# Patient Record
Sex: Female | Born: 1980 | Hispanic: No | Marital: Married | Smoking: Never smoker
Health system: Southern US, Community
[De-identification: ages and names within clinical notes are randomized; demographics above are authoritative.]

## PROBLEM LIST (undated history)

## (undated) DIAGNOSIS — K589 Irritable bowel syndrome without diarrhea: Secondary | ICD-10-CM

## (undated) DIAGNOSIS — F419 Anxiety disorder, unspecified: Secondary | ICD-10-CM

## (undated) DIAGNOSIS — K635 Polyp of colon: Secondary | ICD-10-CM

## (undated) DIAGNOSIS — T7840XA Allergy, unspecified, initial encounter: Secondary | ICD-10-CM

## (undated) DIAGNOSIS — K900 Celiac disease: Secondary | ICD-10-CM

## (undated) DIAGNOSIS — A0472 Enterocolitis due to Clostridium difficile, not specified as recurrent: Secondary | ICD-10-CM

## (undated) DIAGNOSIS — K219 Gastro-esophageal reflux disease without esophagitis: Secondary | ICD-10-CM

## (undated) DIAGNOSIS — D649 Anemia, unspecified: Secondary | ICD-10-CM

## (undated) HISTORY — DX: Anemia, unspecified: D64.9

## (undated) HISTORY — DX: Enterocolitis due to Clostridium difficile, not specified as recurrent: A04.72

## (undated) HISTORY — PX: COLONOSCOPY: SHX174

## (undated) HISTORY — DX: Irritable bowel syndrome, unspecified: K58.9

## (undated) HISTORY — DX: Celiac disease: K90.0

## (undated) HISTORY — DX: Polyp of colon: K63.5

## (undated) HISTORY — PX: UPPER GASTROINTESTINAL ENDOSCOPY: SHX188

## (undated) HISTORY — DX: Anxiety disorder, unspecified: F41.9

## (undated) HISTORY — DX: Gastro-esophageal reflux disease without esophagitis: K21.9

## (undated) HISTORY — DX: Allergy, unspecified, initial encounter: T78.40XA

---

## 2015-08-05 ENCOUNTER — Other Ambulatory Visit: Payer: Self-pay | Admitting: Gastroenterology

## 2015-08-05 DIAGNOSIS — R1033 Periumbilical pain: Secondary | ICD-10-CM

## 2015-08-10 ENCOUNTER — Ambulatory Visit (HOSPITAL_COMMUNITY)
Admission: RE | Admit: 2015-08-10 | Discharge: 2015-08-10 | Disposition: A | Discharge status: Home / Self Care | Payer: Managed Care, Other (non HMO) | Source: Ambulatory Visit | Attending: Gastroenterology | Admitting: Gastroenterology

## 2015-08-10 DIAGNOSIS — R1033 Periumbilical pain: Secondary | ICD-10-CM | POA: Diagnosis present

## 2015-08-12 ENCOUNTER — Ambulatory Visit (HOSPITAL_COMMUNITY)
Admission: RE | Admit: 2015-08-12 | Discharge: 2015-08-12 | Disposition: A | Discharge status: Home / Self Care | Payer: Managed Care, Other (non HMO) | Source: Ambulatory Visit | Attending: Gastroenterology | Admitting: Gastroenterology

## 2015-08-12 DIAGNOSIS — R1033 Periumbilical pain: Secondary | ICD-10-CM | POA: Diagnosis present

## 2015-08-12 DIAGNOSIS — R1011 Right upper quadrant pain: Secondary | ICD-10-CM | POA: Diagnosis not present

## 2015-08-12 DIAGNOSIS — R110 Nausea: Secondary | ICD-10-CM | POA: Insufficient documentation

## 2015-08-12 MED ORDER — SINCALIDE 5 MCG IJ SOLR
0.0200 ug/kg | Freq: Once | INTRAMUSCULAR | Status: AC
  Administered 2015-08-12: 1.1 ug via INTRAVENOUS

## 2015-08-12 MED ORDER — TECHNETIUM TC 99M MEBROFENIN IV KIT
5.0000 | PACK | Freq: Once | INTRAVENOUS | Status: DC | PRN
  Administered 2015-08-12: 5 via INTRAVENOUS
  Filled 2015-08-12: qty 6

## 2015-08-12 MED ORDER — SINCALIDE 5 MCG IJ SOLR
INTRAMUSCULAR | Status: AC
  Administered 2015-08-12: 1.1 ug via INTRAVENOUS
  Filled 2015-08-12: qty 5

## 2015-08-12 MED ORDER — STERILE WATER FOR INJECTION IJ SOLN
INTRAMUSCULAR | Status: AC
  Filled 2015-08-12: qty 10

## 2015-10-20 ENCOUNTER — Other Ambulatory Visit: Payer: Self-pay | Admitting: Gastroenterology

## 2015-10-20 DIAGNOSIS — R1033 Periumbilical pain: Secondary | ICD-10-CM

## 2015-10-20 DIAGNOSIS — R634 Abnormal weight loss: Secondary | ICD-10-CM

## 2015-10-30 ENCOUNTER — Other Ambulatory Visit: Payer: Managed Care, Other (non HMO)

## 2015-10-30 ENCOUNTER — Ambulatory Visit
Admission: RE | Admit: 2015-10-30 | Discharge: 2015-10-30 | Disposition: A | Discharge status: Home / Self Care | Payer: Managed Care, Other (non HMO) | Source: Ambulatory Visit | Attending: Gastroenterology | Admitting: Gastroenterology

## 2015-10-30 DIAGNOSIS — R1033 Periumbilical pain: Secondary | ICD-10-CM

## 2015-10-30 DIAGNOSIS — R634 Abnormal weight loss: Secondary | ICD-10-CM

## 2015-10-30 MED ORDER — IOPAMIDOL (ISOVUE-300) INJECTION 61%
100.0000 mL | Freq: Once | INTRAVENOUS | Status: AC | PRN
Start: 2015-10-30 — End: 2015-10-30
  Administered 2015-10-30: 100 mL via INTRAVENOUS

## 2015-11-04 ENCOUNTER — Telehealth: Payer: Self-pay | Admitting: Obstetrics and Gynecology

## 2015-11-04 NOTE — Telephone Encounter (Signed)
Called and left a message for patient to call back to schedule a new patient doctor referral. °

## 2015-11-05 ENCOUNTER — Encounter: Payer: Self-pay | Admitting: Obstetrics and Gynecology

## 2015-11-05 ENCOUNTER — Ambulatory Visit (INDEPENDENT_AMBULATORY_CARE_PROVIDER_SITE_OTHER): Payer: Managed Care, Other (non HMO) | Admitting: Obstetrics and Gynecology

## 2015-11-05 VITALS — BP 100/60 | HR 68 | Resp 14 | Ht 63.25 in | Wt 110.0 lb

## 2015-11-05 DIAGNOSIS — R140 Abdominal distension (gaseous): Secondary | ICD-10-CM

## 2015-11-05 DIAGNOSIS — R1033 Periumbilical pain: Secondary | ICD-10-CM

## 2015-11-05 DIAGNOSIS — R110 Nausea: Secondary | ICD-10-CM | POA: Diagnosis not present

## 2015-11-05 DIAGNOSIS — N946 Dysmenorrhea, unspecified: Secondary | ICD-10-CM | POA: Diagnosis not present

## 2015-11-05 NOTE — Progress Notes (Signed)
Patient ID: Jane Torres, female   DOB: June 29, 1981, 34 y.o.   MRN: [id number] GYNECOLOGY  VISIT   HPI: 34 y.o.   Married  [place]  female   [phone] with Patient's last menstrual period was 10/17/2015.   The patient is sent for a consultation from Dr Collene Mares for c/o abdominal pain, weight loss and fatigue  x 6 months   W/U to date has included, normal CBC, CMP, TSH, negative RA, normal HgbA1C, negative food allergy testing, negative Sjogren's panel, negative scleroderma panel, normal abdominal ultrasound, normal abdominal/pelvic CT (1 cm myoma noted). She has had an endoscopy, biopsy with intra-epithelial lymphocytosis, otherwise normal. She does have a h/o constipation and an anal fissure. Currently BM's qd. No urinary c/o. Her pain started 6 months ago. She describes it as an intermittent, burning, periumbilical pain that is up to a 4-5/10 in severity. The pain can come off and on x 3-4 days. She has a gluten sensitivity. Has been gluten free x 4 months. Her symptoms continue. No dietary changes in her pain. She feels bloated. A different crampy abdominal pain stopped with stopping gluten. Normal BM qam. She has some nausea, no emesis. She does report feeling hungry, but fills up quickly. She can go up to 4 days without pain.  Menses q month x 4-5 days. Changes her pad every 4 hours. No BTB. 1 day of cramps, up to a 7/10 in severity, doesn't take any medication, able to function.  Sexually active, no dyspareunia. Not trying to get pregnant, using condoms most of the time.   GYNECOLOGIC HISTORY: Patient's last menstrual period was 10/17/2015. Contraception:condoms Menopausal hormone therapy: None        OB History    Gravida Para Term Preterm AB TAB SAB Ectopic Multiple Living   2 1 1  1  1   1     1   NSVD, and 1 SAB (with medication)  There are no active problems to display for this patient.   Past Medical History  Diagnosis Date  . Anxiety   . Celiac disease     History  reviewed. No pertinent past surgical history.  Current Outpatient Prescriptions  Medication Sig Dispense Refill  . Naproxen Sodium (ALEVE PO) Take by mouth.    . Probiotic Product (PROBIOTIC DAILY PO) Take by mouth.     No current facility-administered medications for this visit.     ALLERGIES: Gluten meal  History reviewed. No pertinent family history.  Social History   Social History  . Marital Status: Married    Spouse Name: N/A  . Number of Children: N/A  . Years of Education: N/A   Occupational History  . Not on file.   Social History Main Topics  . Smoking status: Never Smoker   . Smokeless tobacco: Never Used  . Alcohol Use: No  . Drug Use: No  . Sexual Activity:    Partners: Male   Other Topics Concern  . Not on file   Social History Narrative  . No narrative on file   No significant family medical history.   Review of Systems  Constitutional: Positive for weight loss and malaise/fatigue.  Gastrointestinal: Positive for abdominal pain.  All other systems reviewed and are negative.  Records from Dr Collene Mares reviewed, including labs, CT and abdominal ultrasound   PHYSICAL EXAMINATION:    BP 100/60 mmHg  Pulse 68  Resp 14  Ht 5' 3.25" (1.607 m)  Wt 110 lb (49.896 kg)  BMI 19.32 kg/m2  LMP 10/17/2015    General appearance: alert, cooperative and appears stated age Head: Normocephalic, without obvious abnormality, atraumatic Neck: no adenopathy, supple, symmetrical, trachea midline and thyroid normal to inspection and palpation Lungs: clear to auscultation bilaterally Heart: regular rate and rhythm Abdomen: soft, tender in epigastric area and to the left of the umbilicus. Less tender with tensing her abdominal muscles. No rebound, no guarding, mildly distended Extremities: extremities normal, atraumatic, no cyanosis or edema Skin: Skin color, texture, turgor normal. No rashes or lesions Lymph nodes: Cervical, supraclavicular, and axillary nodes  normal. No abnormal inguinal nodes palpated Neurologic: Grossly normal Pelvic: External genitalia:  no lesions              Urethra:  normal appearing urethra with no masses, tenderness or lesions              Bartholins and Skenes: normal                 Vagina: normal appearing vagina with normal color and discharge, no lesions              Cervix: no lesions              Bimanual Exam:  Uterus:  normal size, contour, position, consistency, mobility, non-tender. No pelvic floor tenderness              Adnexa: no mass, fullness, tenderness              Rectovaginal: Yes.  .  Confirms.              Anus:  normal sphincter tone, no lesions  Chaperone was present for exam.  ASSESSMENT 6 month h/o intermittent periumbilical pain, c/o gas and nausea, normal BM's. Normal abdominal/pelvic CT, normal pelvic exam. No urinary c/o, not cw musculoskeletal pain, no hernia. I don't think her pain is of a GYN origin Epigastric pain Sexually active, intermittent use of condoms Dysmenorrhea is tolerable, declines medication    PLAN Recommend she be on a multivitamin with folic acid (inconsistent birth control) Try gas-x or beano F/u with Dr Collene Mares, I do not feel her pain is from a GYN origin F/U in 2 months for an annual exam  CC: Dr Collene Mares   An After Visit Summary was printed and given to the patient.

## 2015-11-05 NOTE — Patient Instructions (Signed)
Try Gas-X or Beano  F/u with Dr Collene Mares

## 2015-11-11 ENCOUNTER — Other Ambulatory Visit: Payer: Self-pay

## 2015-12-09 ENCOUNTER — Ambulatory Visit: Payer: Managed Care, Other (non HMO) | Admitting: Obstetrics and Gynecology

## 2015-12-16 ENCOUNTER — Ambulatory Visit: Payer: Managed Care, Other (non HMO) | Admitting: Obstetrics and Gynecology

## 2015-12-24 ENCOUNTER — Encounter: Payer: Self-pay | Admitting: Obstetrics and Gynecology

## 2015-12-24 ENCOUNTER — Ambulatory Visit (INDEPENDENT_AMBULATORY_CARE_PROVIDER_SITE_OTHER): Payer: Managed Care, Other (non HMO) | Admitting: Obstetrics and Gynecology

## 2015-12-24 VITALS — BP 100/60 | HR 80 | Resp 14 | Ht 63.25 in | Wt 109.0 lb

## 2015-12-24 DIAGNOSIS — K602 Anal fissure, unspecified: Secondary | ICD-10-CM

## 2015-12-24 DIAGNOSIS — N946 Dysmenorrhea, unspecified: Secondary | ICD-10-CM | POA: Diagnosis not present

## 2015-12-24 DIAGNOSIS — Z01419 Encounter for gynecological examination (general) (routine) without abnormal findings: Secondary | ICD-10-CM

## 2015-12-24 DIAGNOSIS — L293 Anogenital pruritus, unspecified: Secondary | ICD-10-CM

## 2015-12-24 DIAGNOSIS — Z124 Encounter for screening for malignant neoplasm of cervix: Secondary | ICD-10-CM

## 2015-12-24 MED ORDER — BETAMETHASONE VALERATE 0.1 % EX OINT: TOPICAL_OINTMENT | CUTANEOUS | Status: DC

## 2015-12-24 NOTE — Progress Notes (Signed)
Patient ID: Jane Torres, female   DOB: 1981/06/28, 35 y.o.   MRN: [id number] 35 y.o. [id number] MarriedAsianF here for annual exam.  Her prior abdominal pain has improved, she is off gluten for the last 10 days. Still having pain, but now intermittent. She had a colonoscopy was benign. Endoscopy with slight inflammation in the duodenum.She is loosing weight, has so much weakness. No help with her weakness off of gluten.  She c/o intermittent vaginal pruritus in the last 15 days. No abnormal d/c.  Menses q month x 4-5 days. Saturating a pad 3 x a day. No BTB. She has tolerable cramps.  She is using natural family planning.  Not interested in other contraception. Once she is feeling better, she would like to get pregnant again.     Patient's last menstrual period was 12/06/2015.          Sexually active: Yes.    The current method of family planning is none.    Exercising: No.  The patient does not participate in regular exercise at present. Smoker:  no  Health Maintenance: Pap:  Never per patient husband History of abnormal Pap:  N/A MMG:  Never Colonoscopy:  11-18-15 - waiting on results BMD:   Never TDaP: 5-6 years ago Gardasil: N/A   reports that she has never smoked. She has never used smokeless tobacco. She reports that she does not drink alcohol or use illicit drugs.They have lived in the [place] for the last 10 months. Son is 5.   Past Medical History  Diagnosis Date  . Anxiety   . Celiac disease     History reviewed. No pertinent past surgical history.  Current Outpatient Prescriptions  Medication Sig Dispense Refill  . Naproxen Sodium (ALEVE PO) Take by mouth.    . Probiotic Product (PROBIOTIC DAILY PO) Take by mouth.    . betamethasone valerate ointment (VALISONE) 0.1 % Apply a peas sized amount 2 x a day for 1-2 weeks as needed 15 g 0   No current facility-administered medications for this visit.    History reviewed. No pertinent family history.  Review of Systems   Constitutional: Negative.   HENT: Negative.   Eyes: Negative.   Respiratory: Negative.   Cardiovascular: Negative.   Gastrointestinal: Positive for abdominal pain.  Endocrine: Negative.   Genitourinary: Negative.   Musculoskeletal: Negative.   Skin: Negative.   Allergic/Immunologic: Negative.   Neurological: Negative.   Psychiatric/Behavioral: Negative.     Exam:   BP 100/60 mmHg  Pulse 80  Resp 14  Ht 5' 3.25" (1.607 m)  Wt 109 lb (49.442 kg)  BMI 19.15 kg/m2  LMP 12/06/2015  Weight change: @WEIGHTCHANGE @ Height:   Height: 5' 3.25" (160.7 cm)  Ht Readings from Last 3 Encounters:  12/24/15 5' 3.25" (1.607 m)  11/05/15 5' 3.25" (1.607 m)    General appearance: alert, cooperative and appears stated age Head: Normocephalic, without obvious abnormality, atraumatic Neck: no adenopathy, supple, symmetrical, trachea midline and thyroid normal to inspection and palpation Lungs: clear to auscultation bilaterally Breasts: normal appearance, no masses or tenderness Heart: regular rate and rhythm Abdomen: soft, non-tender; bowel sounds normal; no masses,  no organomegaly Extremities: extremities normal, atraumatic, no cyanosis or edema Skin: Skin color, texture, turgor normal. No rashes or lesions Lymph nodes: Cervical, supraclavicular, and axillary nodes normal. No abnormal inguinal nodes palpated Neurologic: Grossly normal   Pelvic: External genitalia:  no lesions, mild erythema  Urethra:  normal appearing urethra with no masses, tenderness or lesions              Bartholins and Skenes: normal                 Vagina: normal appearing vagina with normal color and discharge, no lesions              Cervix: no lesions, no CMT               Bimanual Exam:  Uterus:  retroverted and mobile, normal sized, mildly tender.               Adnexa: no mass, fullness, tenderness               Rectovaginal: deferred               Anus: anal fissure at 7 o'clock  Chaperone was  present for exam.  Wet prep: no clue, no trich, few wbc KOH: no yeast PH: 4.5   A:  Well Woman with normal exam  Dysmenorrhea, tolerable  Contraception, only using natural family planning, declines other contraception  Genital pruritus, negative vaginal slides  Anal fissure  Screening for cervical cancer  P:    Pap with hpv  Affirm wet prep probe  Steroid ointment for pruritus   Discussed miralax or metamucil to help heal her anal fissure  Start prenatal vitamins  F/U with internist for c/o weakness and weight loss. She is down 1 lb in the last 6 weeks.

## 2015-12-24 NOTE — Patient Instructions (Addendum)
Start a pre-natal vitamins  You can try metmucil or miralax  Anal Fissure, Adult An anal fissure is a small tear or crack in the skin around the anus. Bleeding from a fissure usually stops on its own within a few minutes. However, bleeding will often occur again with each bowel movement until the crack heals. CAUSES This condition may be caused by:  Passing large, hard stool (feces).  Frequent diarrhea.  Constipation.  Inflammatory bowel disease (Crohn disease or ulcerative colitis).  Infections.  Anal sex. SYMPTOMS Symptoms of this condition include:  Bleeding from the rectum.  Small amounts of blood seen on your stool, on toilet paper, or in the toilet after a bowel movement.  Painful bowel movements.  Itching or irritation around the anus. DIAGNOSIS A health care provider may diagnose this condition by closely examining the anal area. An anal fissure can usually be seen with careful inspection. In some cases, a rectal exam may be performed, or a short tube (anoscope) may be used to examine the anal canal. TREATMENT Treatment for this condition may include:  Taking steps to avoid constipation. This may include making changes to your diet, such as increasing your intake of fiber or fluid.  Taking fiber supplements. These supplements can soften your stool to help make bowel movements easier. Your health care provider may also prescribe a stool softener if your stool is often hard.  Taking sitz baths. This may help to heal the tear.  Using medicated creams or ointments. These may be prescribed to lessen discomfort. HOME CARE INSTRUCTIONS Eating and Drinking  Avoid foods that may be constipating, such as bananas and dairy products.  Drink enough fluid to keep your urine clear or pale yellow.  Maintain a diet that is high in fruits, whole grains, and vegetables. General Instructions  Keep the anal area as clean and dry as possible.  Take sitz baths as told by your  health care provider. Do not use soap in the sitz baths.  Take over-the-counter and prescription medicines only as told by your health care provider.  Use creams or ointments only as told by your health care provider.  Keep all follow-up visits as told by your health care provider. This is important. SEEK MEDICAL CARE IF:  You have more bleeding.  You have a fever.  You have diarrhea that is mixed with blood.  You continue to have pain.  Your problem is getting worse rather than better.   This information is not intended to replace advice given to you by your health care provider. Make sure you discuss any questions you have with your health care provider.   Document Released: 10/31/2005 Document Revised: 07/22/2015 Document Reviewed: 01/26/2015 Elsevier Interactive Patient Education 2016 [place] AND DIET:  We recommended that you start or continue a regular exercise program for good health. Regular exercise means any activity that makes your heart beat faster and makes you sweat.  We recommend exercising at least 30 minutes per day at least 3 days a week, preferably 4 or 5.  We also recommend a diet low in fat and sugar.  Inactivity, poor dietary choices and obesity can cause diabetes, heart attack, stroke, and kidney damage, among others.    ALCOHOL AND SMOKING:  Women should limit their alcohol intake to no more than 7 drinks/beers/glasses of wine (combined, not each!) per week. Moderation of alcohol intake to this level decreases your risk of breast cancer and liver damage. And of course, no  recreational drugs are part of a healthy lifestyle.  And absolutely no smoking or even second hand smoke. Most people know smoking can cause heart and lung diseases, but did you know it also contributes to weakening of your bones? Aging of your skin?  Yellowing of your teeth and nails?  CALCIUM AND VITAMIN D:  Adequate intake of calcium and Vitamin D are recommended.  The  recommendations for exact amounts of these supplements seem to change often, but generally speaking 600 mg of calcium (either carbonate or citrate) and 800 units of Vitamin D per day seems prudent. Certain women may benefit from higher intake of Vitamin D.  If you are among these women, your doctor will have told you during your visit.    PAP SMEARS:  Pap smears, to check for cervical cancer or precancers,  have traditionally been done yearly, although recent scientific advances have shown that most women can have pap smears less often.  However, every woman still should have a physical exam from her gynecologist every year. It will include a breast check, inspection of the vulva and vagina to check for abnormal growths or skin changes, a visual exam of the cervix, and then an exam to evaluate the size and shape of the uterus and ovaries.  And after 33 years of age, a rectal exam is indicated to check for rectal cancers. We will also provide age appropriate advice regarding health maintenance, like when you should have certain vaccines, screening for sexually transmitted diseases, bone density testing, colonoscopy, mammograms, etc.   MAMMOGRAMS:  All women over 58 years old should have a yearly mammogram. Many facilities now offer a "3D" mammogram, which may cost around $50 extra out of pocket. If possible,  we recommend you accept the option to have the 3D mammogram performed.  It both reduces the number of women who will be called back for extra views which then turn out to be normal, and it is better than the routine mammogram at detecting truly abnormal areas.    COLONOSCOPY:  Colonoscopy to screen for colon cancer is recommended for all women at age 29.  We know, you hate the idea of the prep.  We agree, BUT, having colon cancer and not knowing it is worse!!  Colon cancer so often starts as a polyp that can be seen and removed at colonscopy, which can quite literally save your life!  And if your first  colonoscopy is normal and you have no family history of colon cancer, most women don't have to have it again for 10 years.  Once every ten years, you can do something that may end up saving your life, right?  We will be happy to help you get it scheduled when you are ready.  Be sure to check your insurance coverage so you understand how much it will cost.  It may be covered as a preventative service at no cost, but you should check your particular policy.

## 2015-12-25 ENCOUNTER — Telehealth: Payer: Self-pay

## 2015-12-25 LAB — WET PREP BY MOLECULAR PROBE
Candida species: NEGATIVE
Gardnerella vaginalis: POSITIVE — AB
Trichomonas vaginosis: NEGATIVE

## 2015-12-25 MED ORDER — METRONIDAZOLE 0.75 % VA GEL: 1.0000 | Freq: Every day | VAGINAL | Status: DC

## 2015-12-25 NOTE — Telephone Encounter (Signed)
-----   Message from Salvadore Dom, MD sent at 12/25/2015  9:06 AM EST ----- Please inform the patient that her vaginitis probe was + for BV and treat with flagyl (either oral or vaginal, her choice), no ETOH while on Flagyl.  Oral: Flagyl 500 mg BID x 7 days, or Vaginal: Metrogel, 1 applicator per vagina q day x 5 days.

## 2015-12-25 NOTE — Telephone Encounter (Signed)
Spoke with patient. Advised of results as seen below from [place]. She is agreeable and verbalizes understanding. She would like to use Metrogel. Rx for Metrogel 1 applicator per vagina q day x 5 days sent to pharmacy on file. The patient is agreeable.  Routing to provider for final review. Patient agreeable to disposition. Will close encounter.

## 2015-12-28 LAB — IPS PAP TEST WITH HPV

## 2016-01-11 DIAGNOSIS — R1030 Lower abdominal pain, unspecified: Secondary | ICD-10-CM | POA: Insufficient documentation

## 2016-01-11 DIAGNOSIS — R143 Flatulence: Secondary | ICD-10-CM | POA: Insufficient documentation

## 2016-01-11 DIAGNOSIS — K580 Irritable bowel syndrome with diarrhea: Secondary | ICD-10-CM | POA: Insufficient documentation

## 2016-01-31 ENCOUNTER — Encounter: Payer: Self-pay | Admitting: Obstetrics and Gynecology

## 2016-02-01 ENCOUNTER — Ambulatory Visit (INDEPENDENT_AMBULATORY_CARE_PROVIDER_SITE_OTHER): Payer: Managed Care, Other (non HMO) | Admitting: Obstetrics and Gynecology

## 2016-02-01 ENCOUNTER — Encounter: Payer: Self-pay | Admitting: Obstetrics and Gynecology

## 2016-02-01 ENCOUNTER — Telehealth: Payer: Self-pay | Admitting: Emergency Medicine

## 2016-02-01 VITALS — BP 98/60 | HR 72 | Resp 16 | Wt 107.0 lb

## 2016-02-01 DIAGNOSIS — R1030 Lower abdominal pain, unspecified: Secondary | ICD-10-CM | POA: Diagnosis not present

## 2016-02-01 DIAGNOSIS — N8184 Pelvic muscle wasting: Secondary | ICD-10-CM

## 2016-02-01 DIAGNOSIS — R102 Pelvic and perineal pain unspecified side: Secondary | ICD-10-CM

## 2016-02-01 DIAGNOSIS — R300 Dysuria: Secondary | ICD-10-CM

## 2016-02-01 DIAGNOSIS — M6289 Other specified disorders of muscle: Secondary | ICD-10-CM

## 2016-02-01 LAB — CBC WITH DIFFERENTIAL/PLATELET
Basophils Absolute: 0 10*3/uL (ref 0.0–0.1)
Basophils Relative: 0 % (ref 0–1)
Eosinophils Absolute: 0.1 10*3/uL (ref 0.0–0.7)
Eosinophils Relative: 1 % (ref 0–5)
HCT: 36.5 % (ref 36.0–46.0)
Hemoglobin: 12.3 g/dL (ref 12.0–15.0)
Lymphocytes Relative: 30 % (ref 12–46)
Lymphs Abs: 2.8 10*3/uL (ref 0.7–4.0)
MCH: 31.1 pg (ref 26.0–34.0)
MCHC: 33.7 g/dL (ref 30.0–36.0)
MCV: 92.2 fL (ref 78.0–100.0)
MPV: 10.1 fL (ref 8.6–12.4)
Monocytes Absolute: 0.7 10*3/uL (ref 0.1–1.0)
Monocytes Relative: 7 % (ref 3–12)
Neutro Abs: 5.8 10*3/uL (ref 1.7–7.7)
Neutrophils Relative %: 62 % (ref 43–77)
Platelets: 355 10*3/uL (ref 150–400)
RBC: 3.96 MIL/uL (ref 3.87–5.11)
RDW: 13 % (ref 11.5–15.5)
WBC: 9.4 10*3/uL (ref 4.0–10.5)

## 2016-02-01 LAB — POCT URINALYSIS DIPSTICK
Bilirubin, UA: NEGATIVE
Blood, UA: NEGATIVE
Glucose, UA: NEGATIVE
Ketones, UA: NEGATIVE
Leukocytes, UA: NEGATIVE
Nitrite, UA: NEGATIVE
Protein, UA: NEGATIVE
Urobilinogen, UA: NEGATIVE
pH, UA: 7

## 2016-02-01 LAB — POCT URINE PREGNANCY: Preg Test, Ur: NEGATIVE

## 2016-02-01 MED ORDER — NAPROXEN SODIUM 550 MG PO TABS: ORAL_TABLET | ORAL | Status: DC

## 2016-02-01 NOTE — Telephone Encounter (Signed)
Call to patient. She states she had her cycle 3/12 to 3/16 and this was a normal cycle for her, since her cycle ended she has had feelings of lower pelvic pain (diffuse in location) and describes pain as feeling just like it usually does when she is on her first day of her cycle. Has not tried any OTC medications for pain. She states when she presses on the area that hurts, she feels pressure in the vagina. She states she had constipation with her cycle but is not having normal bowel movements. Reports she feels some pressure with urination, however, she states she does not have an urge to urinate or pain with urination.   Office visit today at 1430 with Dr. Talbert Nan scheduled. Patient agreeable.   Routing to provider for final review. Patient agreeable to disposition. Will close encounter.

## 2016-02-01 NOTE — Telephone Encounter (Signed)
Telephone call for triage created to discuss message with patient and disposition as appropriate.   

## 2016-02-01 NOTE — Patient Instructions (Signed)
Call with fevers, worsening pain, or any other concerns

## 2016-02-01 NOTE — Telephone Encounter (Signed)
Chief Complaint  Patient presents with  . Advice Only    Patient sent mychart message     ===View-only below this line===   ----- Message -----    From: Jacqulyn Liner    Sent: 01/31/2016  9:46 PM EDT      To: Salvadore Dom, MD Subject: Non-Urgent Medical Question  Hi Dr Sharee Pimple   I am having lower abdominal pain and back pain like mensturation pain for last 3 days,which started just after period(3/12 -3/16). Means from 16th night.  Do u have any advice for me.  Thanks  [employer]

## 2016-02-01 NOTE — Progress Notes (Signed)
Patient ID: Jane Torres, female   DOB: 08-21-1981, 35 y.o.   MRN: [id number] GYNECOLOGY  VISIT   HPI: 35 y.o.   Married  [place]  female   [phone] with Patient's last menstrual period was 01/24/2016.   here c/o pelvic pain that started 4 days ago, just after her cycle ended. The pain in her BLQ, mid abdomen, she also c/o bilateral lower back pain. Some distention. The pain is crampy like menstrual pain, constant. Pain ranges from 4-7/10 in severity. Getting worse. No fever, but feels chills. Slight dysuria, some urinary pressure, some urinary urgency, no urinary frequency, voiding normal amounts. When the pain is severe she has some hesitancy to void. No nausea or emesis, h/o constipation, but not now. She does feel a little better after a BM. 2 BM's today.  No dyspareunia, but she does c/o lower abdominal pain for 1-2 days after intercourse over the last month.  She was given an antibiotic by GI for bacterial overgrowth.    GYNECOLOGIC HISTORY: Patient's last menstrual period was 01/24/2016. Contraception:none  Menopausal hormone therapy: none         OB History    Gravida Para Term Preterm AB TAB SAB Ectopic Multiple Living   2 1 1  1  1   1          There are no active problems to display for this patient.   Past Medical History  Diagnosis Date  . Anxiety   . Celiac disease     History reviewed. No pertinent past surgical history.  Current Outpatient Prescriptions  Medication Sig Dispense Refill  . B Complex-C (B-COMPLEX WITH VITAMIN C) tablet Take 1 tablet by mouth daily.    . Probiotic Product (PROBIOTIC DAILY PO) Take by mouth.     No current facility-administered medications for this visit.     ALLERGIES: Gluten meal  History reviewed. No pertinent family history.  Social History   Social History  . Marital Status: Married    Spouse Name: N/A  . Number of Children: N/A  . Years of Education: N/A   Occupational History  . Not on file.   Social  History Main Topics  . Smoking status: Never Smoker   . Smokeless tobacco: Never Used  . Alcohol Use: No  . Drug Use: No  . Sexual Activity:    Partners: Male   Other Topics Concern  . Not on file   Social History Narrative    Review of Systems  Constitutional: Negative.   HENT: Negative.   Eyes: Negative.   Respiratory: Negative.   Cardiovascular: Negative.   Gastrointestinal: Negative.   Genitourinary:       Pelvic pain   Musculoskeletal: Negative.   Skin: Negative.   Neurological: Negative.   Endo/Heme/Allergies: Negative.   Psychiatric/Behavioral: Negative.     PHYSICAL EXAMINATION:    BP 98/60 mmHg  Pulse 72  Resp 16  Wt 107 lb (48.535 kg)  LMP 01/24/2016    General appearance: alert, cooperative and appears stated age Abdomen: soft, mildly tender BLQ, mildly distended, no masses. No rebound or guarding  Pelvic: External genitalia:  no lesions              Urethra:  normal appearing urethra with no masses, tenderness or lesions              Bartholins and Skenes: normal                 Vagina: normal appearing vagina  with normal color and discharge, no lesions              Cervix: no lesions, no CMT              Bimanual Exam:  Uterus:  normal sized, mobile, mildly tender.              Adnexa: no fullness, mildly tender bilaterally              Pelvic floor: bilaterally tender Bladder: mildly tender  Chaperone was present for exam.  ASSESSMENT 4 day h/o BLQ abdominal/pelvic pain, mild urinary c/o, but negative dip. She is tender in the BLQ and mildly distended with mild bowel symptoms. Also tender on pelvic exam, including pelvic floor. No CMT. Pelvic pain after intercourse    PLAN  UPT negative CBC with diff Return for a GYN ultrasound Send urine for ua, c&s Anaprox for pain Call with fevers, worsening pain or any other concerns Consider treatment for possible endometritis depending on f/u exam (particularly given pain after intercourse). She did  seem more tender with palpation of the pelvic floor than her uterus. No CMT If pelvic floor tenderness persists, will refer to PT   An After Visit Summary was printed and given to the patient.

## 2016-02-02 ENCOUNTER — Ambulatory Visit: Payer: Managed Care, Other (non HMO) | Admitting: Obstetrics and Gynecology

## 2016-02-02 ENCOUNTER — Telehealth: Payer: Self-pay | Admitting: Obstetrics and Gynecology

## 2016-02-02 LAB — URINALYSIS, MICROSCOPIC ONLY
Bacteria, UA: NONE SEEN [HPF]
Casts: NONE SEEN [LPF]
Crystals: NONE SEEN [HPF]
Squamous Epithelial / LPF: NONE SEEN [HPF] (ref <=5)
WBC, UA: NONE SEEN WBC/HPF (ref <=5)
Yeast: NONE SEEN [HPF]

## 2016-02-02 LAB — URINE CULTURE
Colony Count: NO GROWTH
Organism ID, Bacteria: NO GROWTH

## 2016-02-02 NOTE — Telephone Encounter (Signed)
Spoke with pt spouse (he is on patients DPR) states Ms Jane Torres not available, but he requested to schedule ultrasound. Reviewed benefits for ultrasound. Mr. Jane Torres understood and agreeable. Patient scheduled 02/04/16 with Dr Talbert Nan.  aware of arrival date and time, as well as 72 hours cancellation policy with $458 fee. No further questions. Ok to close

## 2016-02-04 ENCOUNTER — Ambulatory Visit (INDEPENDENT_AMBULATORY_CARE_PROVIDER_SITE_OTHER): Payer: Managed Care, Other (non HMO) | Admitting: Obstetrics and Gynecology

## 2016-02-04 ENCOUNTER — Ambulatory Visit (INDEPENDENT_AMBULATORY_CARE_PROVIDER_SITE_OTHER): Payer: Managed Care, Other (non HMO)

## 2016-02-04 ENCOUNTER — Encounter: Payer: Self-pay | Admitting: Obstetrics and Gynecology

## 2016-02-04 VITALS — BP 110/70 | HR 64 | Resp 16 | Wt 107.0 lb

## 2016-02-04 DIAGNOSIS — R1030 Lower abdominal pain, unspecified: Secondary | ICD-10-CM | POA: Diagnosis not present

## 2016-02-04 DIAGNOSIS — R102 Pelvic and perineal pain: Secondary | ICD-10-CM | POA: Diagnosis not present

## 2016-02-04 NOTE — Progress Notes (Signed)
GYNECOLOGY  VISIT   HPI: 35 y.o.   Married  [place]  female   [phone] with Patient's last menstrual period was 01/24/2016.   here for  F/u of lower abdominal pain and pain after intercourse. She has been having lower abdominal discomfort for the last week, a little better today compared to earlier this week. She has 1-2 BM's a day, sometimes mild constipation (firm, straining). Some bloating, she tried gas-ex and it helped some. She had a normal CBC and urine culture a few days ago. Normal GYN ultrasound today (other than a 57m focus, possible polyp). She has normal menstrual cycles.  She also has had issues with pain after intercourse over the last month or two. She was slightly tender on uterine exam earlier this week.  GYNECOLOGIC HISTORY: Patient's last menstrual period was 01/24/2016. Contraception:None Menopausal hormone therapy: None        OB History    Gravida Para Term Preterm AB TAB SAB Ectopic Multiple Living   2 1 1  1  1   1          There are no active problems to display for this patient.   Past Medical History  Diagnosis Date  . Anxiety   . Celiac disease     History reviewed. No pertinent past surgical history.  Current Outpatient Prescriptions  Medication Sig Dispense Refill  . B Complex-C (B-COMPLEX WITH VITAMIN C) tablet Take 1 tablet by mouth daily.    . naproxen sodium (ANAPROX DS) 550 MG tablet 1 tab po q 12 hours prn pain 30 tablet 2  . Probiotic Product (PROBIOTIC DAILY PO) Take by mouth.     No current facility-administered medications for this visit.     ALLERGIES: Gluten meal  History reviewed. No pertinent family history.  Social History   Social History  . Marital Status: Married    Spouse Name: N/A  . Number of Children: N/A  . Years of Education: N/A   Occupational History  . Not on file.   Social History Main Topics  . Smoking status: Never Smoker   . Smokeless tobacco: Never Used  . Alcohol Use: No  . Drug Use: No  .  Sexual Activity:    Partners: Male   Other Topics Concern  . Not on file   Social History Narrative    Review of Systems  Constitutional: Negative.   HENT: Negative.   Eyes: Negative.   Respiratory: Negative.   Cardiovascular: Negative.   Gastrointestinal: Positive for abdominal pain.       Bloating  Musculoskeletal: Negative.   Skin: Negative.   Neurological: Negative.   Endo/Heme/Allergies: Negative.   Psychiatric/Behavioral: Negative.     PHYSICAL EXAMINATION:    BP 110/70 mmHg  Pulse 64  Resp 16  Wt 107 lb (48.535 kg)  LMP 01/24/2016    General appearance: alert, cooperative and appears stated age Abdomen: soft, tender in the lower abdomen in the region of the ascending and descending colon. She is mildly distended.  Pelvic: External genitalia:  no lesions              Cervix: no CMT              Bimanual Exam:  Uterus:  normal sized, mobile, minimally tender (less tender than the other day). Retroverted.              Adnexa: no mass, fullness, tenderness  Chaperone was present for exam.  ASSESSMENT Abdominal pain, tender in the area of the ascending and descending colon, increased gas and mild constipation. I think her abdominal pain is GYN Pain after intercourse. She had a normal GYN ultrasound today. Today minimally tender with palpation of the uterus Small endometrial focus, possible polyp vs normal variation. The patient has normal cycles, not concerning    PLAN She has an appointment to f/u with GI on Monday Continue to use Gas-ex as needed, helping some Discussed the option of a trial of doxycyline for possible endometritis. She wants to wait and see if the postcoital pain persists. She will call if she wants to try the antibiotic She would like referral to an internist (will set up an appointment for her)    An After Visit Summary was printed and given to the patient.  15 minutes face to face time of which over 50% was spent in  counseling.

## 2016-02-08 ENCOUNTER — Telehealth: Payer: Self-pay | Admitting: Emergency Medicine

## 2016-02-09 NOTE — Telephone Encounter (Signed)
Patient is scheduled to establish care with Dr. Pricilla Holm at [facility].  Call to patient and husband (okay per designated party release form) given appointment information and agrees but does not have pen to write information down. I advised I would send appointment information via mychart and that paperwork from [facility] would be mailed to her home address.  Mychart message sent. He is advised to call back with any questions or concerns from his wife.  Routing to [place] as update and will close.

## 2016-04-20 ENCOUNTER — Ambulatory Visit (INDEPENDENT_AMBULATORY_CARE_PROVIDER_SITE_OTHER): Payer: Managed Care, Other (non HMO) | Admitting: Internal Medicine

## 2016-04-20 ENCOUNTER — Other Ambulatory Visit (INDEPENDENT_AMBULATORY_CARE_PROVIDER_SITE_OTHER): Payer: Managed Care, Other (non HMO)

## 2016-04-20 ENCOUNTER — Encounter: Payer: Self-pay | Admitting: Internal Medicine

## 2016-04-20 VITALS — BP 104/62 | HR 68 | Temp 98.4°F | Resp 14 | Ht 63.0 in | Wt 103.0 lb

## 2016-04-20 DIAGNOSIS — R634 Abnormal weight loss: Secondary | ICD-10-CM

## 2016-04-20 DIAGNOSIS — R143 Flatulence: Secondary | ICD-10-CM

## 2016-04-20 DIAGNOSIS — K580 Irritable bowel syndrome with diarrhea: Secondary | ICD-10-CM

## 2016-04-20 DIAGNOSIS — R197 Diarrhea, unspecified: Secondary | ICD-10-CM

## 2016-04-20 DIAGNOSIS — R1030 Lower abdominal pain, unspecified: Secondary | ICD-10-CM

## 2016-04-20 DIAGNOSIS — R140 Abdominal distension (gaseous): Secondary | ICD-10-CM

## 2016-04-20 DIAGNOSIS — E441 Mild protein-calorie malnutrition: Secondary | ICD-10-CM

## 2016-04-20 LAB — TSH: TSH: 2.07 u[IU]/mL (ref 0.35–4.50)

## 2016-04-20 LAB — COMPREHENSIVE METABOLIC PANEL
ALT: 21 U/L (ref 0–35)
AST: 18 U/L (ref 0–37)
Albumin: 4.6 g/dL (ref 3.5–5.2)
Alkaline Phosphatase: 64 U/L (ref 39–117)
BUN: 5 mg/dL — ABNORMAL LOW (ref 6–23)
CO2: 30 mEq/L (ref 19–32)
Calcium: 10 mg/dL (ref 8.4–10.5)
Chloride: 105 mEq/L (ref 96–112)
Creatinine, Ser: 0.63 mg/dL (ref 0.40–1.20)
GFR: 115.87 mL/min (ref >60.00)
Glucose, Bld: 88 mg/dL (ref 70–99)
Potassium: 4 mEq/L (ref 3.5–5.1)
Sodium: 140 mEq/L (ref 135–145)
Total Bilirubin: 1 mg/dL (ref 0.2–1.2)
Total Protein: 7.7 g/dL (ref 6.0–8.3)

## 2016-04-20 LAB — TESTOSTERONE: Testosterone: 53.57 ng/dL — ABNORMAL HIGH (ref 15.00–40.00)

## 2016-04-20 LAB — T4, FREE: Free T4: 0.88 ng/dL (ref 0.60–1.60)

## 2016-04-20 NOTE — Patient Instructions (Signed)
We will look over the records and see what we can find.   We will get you in with the nutritionist to talk to them.   We are checking the labs today.

## 2016-04-20 NOTE — Progress Notes (Signed)
Pre visit review using our clinic review tool, if applicable. No additional management support is needed unless otherwise documented below in the visit note. 

## 2016-04-20 NOTE — Progress Notes (Signed)
   Subjective:    Patient ID: Maxi Carreras, female    DOB: [birth year], 35 y.o.   MRN: [id number]  HPI The patient is a 35 YO female coming in for weight loss of 35 pounds over the last year. She does have gluten allergy although added back gluten without change to GI symptoms. Having intermittent low abdominal pain. She has discussed with her GI doctor. She has a local GI doctor as well as one at [facility]. Many GI complaints and given fodmap diet. This did not help at all. She notices that oil and spices bother her stomach. She is not taking levsin as it did not help. She is using gas-x which helps some. Overall is very frustrated with the amount of care she has had and that no one is able to find an answer. She is also having pericyclical pelvic pain. She had US done at ob/gyn which did not show any cysts. All this started right after she can to the [place] from [place] via [place]. Denies any major life changes around that time. Some of the pelvic pain is worse since miscarriage about 2 years ago. She does have records from some of that and brings some of it with her today. History is provided by her husband as well.   PMH, [facility], social history reviewed and updated.   Review of Systems  Constitutional: Positive for activity change, appetite change and unexpected weight change. Negative for fever, chills and fatigue.  HENT: Negative.   Eyes: Negative.   Respiratory: Negative for cough, chest tightness and shortness of breath.   Cardiovascular: Negative for chest pain, palpitations and leg swelling.  Gastrointestinal: Positive for nausea, abdominal pain, diarrhea and abdominal distention. Negative for vomiting and constipation.  Musculoskeletal: Negative.   Skin: Negative.   Neurological: Negative.   Psychiatric/Behavioral: Negative.       Objective:   Physical Exam  Constitutional: She is oriented to person, place, and time. She appears well-developed.  Thin  HENT:  Head:  Normocephalic and atraumatic.  Eyes: EOM are normal.  Neck: Normal range of motion.  Cardiovascular: Normal rate and regular rhythm.   Pulmonary/Chest: Effort normal and breath sounds normal. No respiratory distress. She has no wheezes. She has no rales.  Abdominal: Soft. Bowel sounds are normal. She exhibits no distension. There is tenderness. There is no rebound.  Mild tenderness on exam diffusely and especially in the lower abdomen.   Musculoskeletal: She exhibits no edema.  No joint swelling.   Neurological: She is alert and oriented to person, place, and time. Coordination normal.  Skin: Skin is warm and dry.  Psychiatric: She has a normal mood and affect.   Filed Vitals:   04/20/16 0901  BP: 104/62  Pulse: 68  Temp: 98.4 F (36.9 C)  TempSrc: Oral  Resp: 14  Height: 5' 3"  (1.6 m)  Weight: 103 lb (46.72 kg)  SpO2: 99%      Assessment & Plan:  Visit time 45 minutes: greater than 50% of that time was spent in counseling and coordination of care face to face with patient. Counseling about her condition and excessive time reviewing patient records with her and clarifying story.

## 2016-04-21 DIAGNOSIS — E441 Mild protein-calorie malnutrition: Secondary | ICD-10-CM | POA: Insufficient documentation

## 2016-04-21 DIAGNOSIS — R634 Abnormal weight loss: Secondary | ICD-10-CM | POA: Insufficient documentation

## 2016-04-21 LAB — TISSUE TRANSGLUTAMINASE, IGA: Tissue Transglutaminase Ab, IgA: 1 U/mL (ref <4)

## 2016-04-21 LAB — GLIADIN ANTIBODIES, SERUM
Gliadin IgA: 2 Units (ref <20)
Gliadin IgG: 2 Units (ref <20)

## 2016-04-21 LAB — PROGESTERONE: Progesterone: 1.2 ng/mL

## 2016-04-21 NOTE — Assessment & Plan Note (Signed)
She can continue using gas-x and probiotic. Checking some additional labs today.

## 2016-04-21 NOTE — Assessment & Plan Note (Signed)
She will continue seeing local and tertiary GI doctors. She is frustrated by lack of answers. Will need to review all her records to ensure no missed information. It is unclear if there is some stress component as this all started on arrival to [place].

## 2016-04-21 NOTE — Assessment & Plan Note (Signed)
Given this extended time period will need to review her records. She has had colonoscopy and EGD which were unrevealing. She has normal CBC and many other labs which will be reviewed. Given her history of thyroid problems during pregnancy will check thyroid again as well as celiac panel. Referred to nutrition to make sure she is able to get adequate calories to maintain her weight.

## 2016-04-21 NOTE — Assessment & Plan Note (Signed)
Due to not being able to eat due to stomach pain and bloating. Referral to nutrition for them to encourage her of options and adequate calorie intake. She may have some degree of malabsorption although that has not been shown in workup as of yet.

## 2016-04-21 NOTE — Assessment & Plan Note (Signed)
History of change with cycle is consistent with endometriosis. She did not have fibroids or ovarian cyst on [place] with gyn. Have asked her to talk to gyn about hormonal control with OCP for stability to see if this helps.

## 2016-04-22 ENCOUNTER — Telehealth: Payer: Self-pay | Admitting: Internal Medicine

## 2016-04-22 LAB — RETICULIN ANTIBODIES, IGA W TITER: Reticulin Ab, IgA: NEGATIVE

## 2016-04-22 NOTE — Telephone Encounter (Signed)
Pt request lab result. Please call her back

## 2016-04-25 ENCOUNTER — Encounter: Payer: Self-pay | Admitting: Internal Medicine

## 2016-04-25 LAB — ESTROGENS, TOTAL: Estrogen: 429.5 pg/mL

## 2016-04-26 NOTE — Telephone Encounter (Signed)
Left message informing patient that the results are visible on myChart.

## 2016-04-28 ENCOUNTER — Encounter: Payer: Self-pay | Admitting: Internal Medicine

## 2016-05-08 ENCOUNTER — Encounter: Payer: Self-pay | Admitting: Internal Medicine

## 2016-05-08 DIAGNOSIS — R197 Diarrhea, unspecified: Secondary | ICD-10-CM

## 2016-05-13 ENCOUNTER — Encounter: Payer: Self-pay | Admitting: Internal Medicine

## 2016-05-18 ENCOUNTER — Encounter: Payer: Self-pay | Admitting: Internal Medicine

## 2016-05-18 ENCOUNTER — Encounter: Payer: Managed Care, Other (non HMO) | Attending: Internal Medicine | Admitting: Dietician

## 2016-05-18 ENCOUNTER — Encounter: Payer: Self-pay | Admitting: Dietician

## 2016-05-18 VITALS — Ht 63.0 in | Wt 101.8 lb

## 2016-05-18 DIAGNOSIS — Z731 Type A behavior pattern: Secondary | ICD-10-CM | POA: Insufficient documentation

## 2016-05-18 DIAGNOSIS — E441 Mild protein-calorie malnutrition: Secondary | ICD-10-CM | POA: Diagnosis not present

## 2016-05-18 DIAGNOSIS — R634 Abnormal weight loss: Secondary | ICD-10-CM

## 2016-05-18 NOTE — Patient Instructions (Signed)
-  Try adding walnuts to a meal or snack -Add extra olive oil to anything you want -Try salmon -Try ginger and lemon water for indigestion -Continue to include protein with meals and snacks: protein powder, beans, chicken, fish, tofu, quinoa, tempeh, seitan, eggs, bulse (lentils), nuts and seeds -Try having almond milk with protein shake

## 2016-05-18 NOTE — Progress Notes (Signed)
  Medical Nutrition Therapy:  Appt start time: 255 end time:  355   Assessment:  Primary concerns today: Jane Torres is here today with her husband and son. She reports that she moved to the [place] from [place] in spring 2016 and weighed 140 lbs at that time; her weight today is 101.8 lbs. She began to develop GI symptoms shortly after moving. She complains of weakness, weight loss, headaches, abdominal pain, gas, and bloating. She also complains of occasional heartburn. During endoscopy, she reports inflammation in duodenum was noted. Started a gluten free diet and taking a probiotic. Has had normal bloodwork and CT scan. Jane Torres feels like following a gluten free diet has helped. Symptoms are intermittent; patient states she may feel well for 20-25 days but then symptoms return. Now having 1-2 normal bowel movements per day. Has some nausea but no vomiting. Patient notes more nausea around the time of menstrual period. Has been following current diet (see recall below) for past 6-7 months. Started taking protein shake about 3 months ago.   Preferred Learning Style:   No preference indicated   Learning Readiness:   Ready   MEDICATIONS: Prenatal vitamin and probiotic   DIETARY INTAKE:  Currently following gluten free and dairy free diet.    Usual eating pattern includes 3 meals and 2 snacks per day.  Avoided foods: Cabbage, peas, and cauliflower cause heartburn. Foods that cause discomfort: onion, garlic, spicy foods, gluten, dairy, peanuts, almonds  24-hr recall:  B (8:30-9 AM): fried rice in olive oil and herbs, 1 egg, 1 banana  Snk (11 AM): orange juice OR plant based protein powder mixed with water  L (12:30-1 PM): rice, beans, vegetables Snk ( PM): see morning snack (alernates between orange juice and protein powder) Snk (PM): coconut chips or banana D (8:30 PM): quinoa, vegetables, egg curry, sometimes fried potatoes Snk ( PM): chicken soup  Beverages: orange juice, chicken soup,  protein shake, 6-7 bottles of water per day  Usual physical activity: housework and occasional 15-minute walk  Estimated energy needs: 2000-2400 calories  Progress Towards Goal(s):  In progress.   Nutritional Diagnosis:  Jane Torres-3.2 Unintentional weight loss As related to unknown etiology.  As evidenced by patient report of weight loss of 40 pounds in the past year.    Intervention:  Nutrition counseling provided. Patient was encouraged to continue regular meal and snack pattern. Recommended quality protein source at each meal and snack. Patient was requested to have a second protein shake rather than skip a meal. Briefly discussed Mediterranean diet and anti-inflammatory foods. Encouraged increased intake of unsaturated fats like nuts, nut butters, seeds, fish, and olive oil.  Goals:  -Try adding walnuts to a meal or snack -Add extra olive oil to anything you want -Try salmon -Try ginger and lemon water for indigestion -Continue to include protein with meals and snacks: protein powder, beans, chicken, fish, tofu, quinoa, tempeh, seitan, eggs, bulse (lentils), nuts and seeds -Try having almond milk with protein shake instead of water  Teaching Method Utilized:  Visual Auditory Hands on  Handouts given during visit include:  none  Barriers to learning/adherence to lifestyle change: abdominal pain, bloating, and discomfort  Demonstrated degree of understanding via:  Teach Back   Monitoring/Evaluation:  Dietary intake, exercise, and body weight prn.

## 2016-05-23 ENCOUNTER — Encounter: Payer: Self-pay | Admitting: Internal Medicine

## 2016-05-25 ENCOUNTER — Encounter: Payer: Self-pay | Admitting: Internal Medicine

## 2016-05-25 NOTE — Telephone Encounter (Signed)
Can you please check on the ID referral?

## 2016-07-05 ENCOUNTER — Encounter: Payer: Self-pay | Admitting: Internal Medicine

## 2016-07-05 ENCOUNTER — Ambulatory Visit (INDEPENDENT_AMBULATORY_CARE_PROVIDER_SITE_OTHER): Payer: Managed Care, Other (non HMO) | Admitting: Internal Medicine

## 2016-07-05 VITALS — BP 109/73 | HR 77 | Temp 97.8°F | Wt 101.1 lb

## 2016-07-05 DIAGNOSIS — A071 Giardiasis [lambliasis]: Secondary | ICD-10-CM | POA: Diagnosis not present

## 2016-07-05 DIAGNOSIS — K529 Noninfective gastroenteritis and colitis, unspecified: Secondary | ICD-10-CM

## 2016-07-05 DIAGNOSIS — R634 Abnormal weight loss: Secondary | ICD-10-CM | POA: Diagnosis not present

## 2016-07-05 MED ORDER — ALBENDAZOLE 200 MG PO TABS
400.0000 mg | ORAL_TABLET | Freq: Two times a day (BID) | ORAL | 0 refills | Status: DC
Start: 2016-07-05 — End: 2016-09-09

## 2016-07-05 NOTE — Progress Notes (Signed)
Patient ID: Jane Torres, female   DOB: 08-11-1981, 35 y.o.   MRN: [id number]  HPI 35yo F with chronic diarrhea and weight loss of #40 that started in late June 2016, roughly one month after relocating to the [place] from [place]..she has had extensive GI work up in seeing gastroenterology locally, dr Collene Mares, as well as [facility] for her symptoms of chronic diarrhea with abdominal cramping, bloating. Her hx includes that in  Sep 2016: egd mild duodenal intraepithelial lymphocytosis Jan 2017: colonoscopy that showed small inflammaotry polyp, sigmoid inflammation.   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   She was ruled out for celiac disease since ig A nl and TTG nl. thow has been on gluten free diet as well as low fodmap for IBS disease. She was ruled out for hpylori. Most recently at end of July, she was tested with gi pathogen panel where she was positive to giardia. She was treated with 5 day course of metronidazole. Retested, still positive  Pain and cramping after bowel movements. 2-3 BM a day. Has flatus with it. 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 Took 5 days of metronidazole. Repeated test still shows giardia. Last finished tx roughly 3 wks ago  No change with bowel habits when she adhered to gluten free diet   Loss 40 lb in the last year  Allergies  Allergen Reactions  . Gluten Meal Nausea And Vomiting     Outpatient Encounter Prescriptions as of 07/05/2016  Medication Sig  . B Complex-C (B-COMPLEX WITH VITAMIN C) tablet Take 1 tablet by mouth daily. Reported on 05/18/2016  . Probiotic Product (PROBIOTIC DAILY PO) Take by mouth.  . naproxen sodium (ANAPROX DS) 550 MG tablet 1 tab po q 12 hours prn  pain (Patient not taking: Reported on 04/20/2016)  . Prenatal Vit-Fe Fumarate-FA (MULTIVITAMIN-PRENATAL) 27-0.8 MG TABS tablet Take 1 tablet by mouth daily at 12 noon.   No facility-administered encounter medications on file as of 07/05/2016.      Patient Active Problem List   Diagnosis Date Noted  . Loss of weight 04/21/2016  . Mild malnutrition ([place]) 04/21/2016  . Excessive flatus 01/11/2016  . Irritable bowel syndrome with diarrhea 01/11/2016  . Abdominal pain, lower 01/11/2016     Health Maintenance Due  Topic Date Due  . HIV Screening  07/30/1998  . INFLUENZA VACCINE  06/14/2016    Social History  Substance Use Topics  . Smoking status: Never Smoker  . Smokeless tobacco: Never Used  . Alcohol use No  - no family hx of colon ca. Or IBS  Review of Systems Review of Systems  Constitutional: positive for unintentional weight loss. Negative for fever, chills, diaphoresis, activity change, appetite change, fatigue and   HENT: Negative for congestion, sore throat, rhinorrhea, sneezing, trouble swallowing and sinus pressure.  Eyes: Negative for photophobia and visual disturbance.  Respiratory: Negative for cough, chest tightness, shortness of breath, wheezing and stridor.  Cardiovascular: Negative for chest pain, palpitations and leg swelling.  Gastrointestinal: positive for nausea, vomiting, abdominal pain, diarrhea, constipation, blood in stool, abdominal distention and anal bleeding.  Genitourinary: Negative for dysuria, hematuria, flank pain and difficulty urinating.  Musculoskeletal: Negative for myalgias, back  pain, joint swelling, arthralgias and gait problem.  Skin: Negative for color change, pallor, rash and wound.  Neurological: Negative for dizziness, tremors, weakness and light-headedness.  Hematological: Negative for adenopathy. Does not bruise/bleed easily.  Psychiatric/Behavioral: Negative for behavioral problems, confusion, sleep disturbance, dysphoric mood,  decreased concentration and agitation.    Physical Exam   BP 109/73   Pulse 77   Temp 97.8 F (36.6 C)   Wt 101 lb 1.9 oz (45.9 kg)   LMP 06/21/2016 (Approximate)   SpO2 98%   BMI 17.91 kg/m   Physical Exam  Constitutional:  oriented to person, place, and time. appears well-developed and well-nourished. No distress.  HENT: [place]/AT, PERRLA, no scleral icterus Mouth/Throat: Oropharynx is clear and moist. No oropharyngeal exudate.  Cardiovascular: Normal rate, regular rhythm and normal heart sounds. Exam reveals no gallop and no friction rub.  No murmur heard.  Pulmonary/Chest: Effort normal and breath sounds normal. No respiratory distress.  has no wheezes.  Neck = supple, no nuchal rigidity Abdominal: Soft. Bowel sounds are normal.  exhibits no distension. Mild LLQ pain Lymphadenopathy: no cervical adenopathy. No axillary adenopathy Neurological: alert and oriented to person, place, and time.  Skin: Skin is warm and dry. No rash noted. No erythema.  Psychiatric: a normal mood and affect.  behavior is normal.   CBC Lab Results  Component Value Date   WBC 9.4 02/01/2016   RBC 3.96 02/01/2016   HGB 12.3 02/01/2016   HCT 36.5 02/01/2016   PLT 355 02/01/2016   MCV 92.2 02/01/2016   MCH 31.1 02/01/2016   MCHC 33.7 02/01/2016   RDW 13.0 02/01/2016   LYMPHSABS 2.8 02/01/2016   MONOABS 0.7 02/01/2016   EOSABS 0.1 02/01/2016   BASOSABS 0.0 02/01/2016   BMET Lab Results  Component Value Date   NA 140 04/20/2016   K 4.0 04/20/2016   CL 105 04/20/2016   CO2 30 04/20/2016   GLUCOSE 88 04/20/2016   BUN 5 (L) 04/20/2016   CREATININE 0.63 04/20/2016   CALCIUM 10.0 04/20/2016   Reviewed old records  Assessment and Plan  will treat for 5 days of albendazole plus metronidazole, 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 Though not sure this would explain her picture over the last year. 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  . May need repeat egd and csy to rule out protein losing enteropathy, intestinal lymphoma, tropical sprue  Spent 45 min with patient with greater than 50% in counseling on diarrhea and plan for treatment

## 2016-07-06 ENCOUNTER — Other Ambulatory Visit: Payer: Managed Care, Other (non HMO)

## 2016-07-06 ENCOUNTER — Encounter: Payer: Self-pay | Admitting: Internal Medicine

## 2016-07-06 MED ORDER — METRONIDAZOLE 250 MG PO TABS: 250.0000 mg | ORAL_TABLET | Freq: Three times a day (TID) | ORAL | 0 refills | Status: AC

## 2016-09-02 ENCOUNTER — Encounter: Payer: Self-pay | Admitting: Internal Medicine

## 2016-09-09 ENCOUNTER — Ambulatory Visit: Payer: Self-pay | Admitting: Internal Medicine

## 2016-09-09 ENCOUNTER — Encounter: Payer: Self-pay | Admitting: Internal Medicine

## 2016-09-09 ENCOUNTER — Ambulatory Visit (INDEPENDENT_AMBULATORY_CARE_PROVIDER_SITE_OTHER): Payer: Managed Care, Other (non HMO) | Admitting: Internal Medicine

## 2016-09-09 VITALS — BP 118/72 | HR 69 | Temp 98.8°F | Resp 12 | Ht 63.0 in | Wt 105.0 lb

## 2016-09-09 DIAGNOSIS — R0789 Other chest pain: Secondary | ICD-10-CM

## 2016-09-09 DIAGNOSIS — G4700 Insomnia, unspecified: Secondary | ICD-10-CM | POA: Diagnosis not present

## 2016-09-09 DIAGNOSIS — K580 Irritable bowel syndrome with diarrhea: Secondary | ICD-10-CM

## 2016-09-09 DIAGNOSIS — E441 Mild protein-calorie malnutrition: Secondary | ICD-10-CM

## 2016-09-09 DIAGNOSIS — R079 Chest pain, unspecified: Secondary | ICD-10-CM

## 2016-09-09 DIAGNOSIS — F411 Generalized anxiety disorder: Secondary | ICD-10-CM

## 2016-09-09 MED ORDER — BUSPIRONE HCL 5 MG PO TABS: 5.0000 mg | ORAL_TABLET | Freq: Two times a day (BID) | ORAL | 0 refills | Status: DC | PRN

## 2016-09-09 MED ORDER — PANTOPRAZOLE SODIUM 40 MG PO TBEC: 40.0000 mg | DELAYED_RELEASE_TABLET | Freq: Every day | ORAL | 3 refills | Status: DC

## 2016-09-09 NOTE — Progress Notes (Signed)
   Subjective:    Patient ID: Jane Torres, female    DOB: 05-Feb-1981, 35 y.o.   MRN: [id number]  HPI The patient is a 35 YO female coming in with several concerns. First she is having chest tightness for the last several weeks (going on 24/7 for the last 2 weeks, she is having more belching and pressure with acid refluxing and pain, she is not able to sleep and eating at all makes this worse, she is getting pressure in her right side chest and center with burning, some relief with belching, she is also having some anxiety regarding this, has not tried anything for it), and next concern is insomnia (she is kept up by the pain/pressure in her chest as well as anxiety, she is worrying all the time and is sleeping maybe 2 hours per night, no energy during the day, no change to diet or caffeine intake, has not tried anything for that either) and her last concern is about her weight and bowels (she is up 2-3 pounds since last visit which is an improvement, she took albendazole and flagyl again from ID 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   Review of Systems  Constitutional: Positive for activity change and appetite change. Negative for fatigue, fever and unexpected weight change.  HENT: Negative.   Respiratory: Positive for chest tightness. Negative for cough, shortness of breath and wheezing.   Cardiovascular: Positive for chest pain. Negative for palpitations and leg swelling.  Gastrointestinal: Positive for abdominal distention, abdominal pain, diarrhea and vomiting. Negative for constipation and nausea.  Musculoskeletal: Negative.   Skin: Negative.   Neurological: Negative.   Psychiatric/Behavioral: Positive for agitation, decreased concentration, dysphoric mood and sleep disturbance. Negative for self-injury and suicidal  ideas. The patient is nervous/anxious.       Objective:   Physical Exam  Constitutional: She is oriented to person, place, and time. She appears well-developed.  Skinny  HENT:  Head: Normocephalic and atraumatic.  Eyes: EOM are normal.  Neck: Normal range of motion.  Cardiovascular: Normal rate and regular rhythm.   No murmur heard. Pulmonary/Chest: Effort normal and breath sounds normal. No respiratory distress. She has no wheezes.  Abdominal: Soft. She exhibits no distension and no mass. There is tenderness. There is no rebound and no guarding.  Mildly hyperactive BS, some tenderness diffuse worse upper middle, no distention  Musculoskeletal: She exhibits no edema.  Neurological: She is alert and oriented to person, place, and time.  Skin: Skin is warm and dry.   Vitals:   09/09/16 1533  BP: 118/72  Pulse: 69  Resp: 12  Temp: 98.8 F (37.1 C)  TempSrc: Oral  SpO2: 99%  Weight: 105 lb (47.6 kg)  Height: 5' 3"  (1.6 m)   EKG: Rate 61, axis normal, sinus, intervals normal (borderline short PR), no st or t wave changes, no old to compare    Assessment & Plan:

## 2016-09-09 NOTE — Progress Notes (Signed)
Pre visit review using our clinic review tool, if applicable. No additional management support is needed unless otherwise documented below in the visit note. 

## 2016-09-09 NOTE — Patient Instructions (Addendum)
Your EKG is normal today so the pain is not coming from your heart.    We have sent in protonix for the acid that is likely causing the pain and tightness in the chest. Take 1 pill daily in the morning.   We have also sent in buspar for the anxiety or nerves that you can take if needed. It is okay to take 1/2 or 1 pill for anxiety. This medicine is not addictive and should not make you sleepy or drowsy.

## 2016-09-11 DIAGNOSIS — F411 Generalized anxiety disorder: Secondary | ICD-10-CM | POA: Insufficient documentation

## 2016-09-11 DIAGNOSIS — G4700 Insomnia, unspecified: Secondary | ICD-10-CM | POA: Insufficient documentation

## 2016-09-11 DIAGNOSIS — K219 Gastro-esophageal reflux disease without esophagitis: Secondary | ICD-10-CM | POA: Insufficient documentation

## 2016-09-11 NOTE — Assessment & Plan Note (Signed)
She is in a period of poor health and changes which have triggered some moderate anxiety. Will try buspar prn first and continue to address the serious health changes. Talked to her about mild exercise to help with some of the anxiety feelings.

## 2016-09-11 NOTE — Assessment & Plan Note (Signed)
Suspect mix of GERD and anxiety. Treating the GERD with protonix daily and anxiety with buspar prn. Address again as needed. Hopefully her sleep will improve since she is not awaking to defecate anymore.

## 2016-09-11 NOTE — Assessment & Plan Note (Signed)
EKg normal which is reassuring, no family history of CAD or early CAD, non smoker. Likely coming from severe GERD symptoms and mix of anxiety as well.

## 2016-09-11 NOTE — Assessment & Plan Note (Signed)
Weight is up about 2 pounds since last visit which is hopeful since her weight was previously down trending over the last 1-2 years.

## 2016-09-11 NOTE — Assessment & Plan Note (Signed)
Improved some with treatment of giardia recently. She does seem to have worsening GERD (prior bx negative for h pylori). Weight is up slightly which is promising.

## 2017-02-14 ENCOUNTER — Ambulatory Visit (INDEPENDENT_AMBULATORY_CARE_PROVIDER_SITE_OTHER): Payer: 59 | Admitting: Obstetrics and Gynecology

## 2017-02-14 ENCOUNTER — Encounter: Payer: Self-pay | Admitting: Obstetrics and Gynecology

## 2017-02-14 VITALS — BP 102/60 | HR 80 | Resp 14 | Wt 107.0 lb

## 2017-02-14 DIAGNOSIS — B373 Candidiasis of vulva and vagina: Secondary | ICD-10-CM

## 2017-02-14 DIAGNOSIS — B3731 Acute candidiasis of vulva and vagina: Secondary | ICD-10-CM

## 2017-02-14 DIAGNOSIS — N898 Other specified noninflammatory disorders of vagina: Secondary | ICD-10-CM

## 2017-02-14 MED ORDER — BETAMETHASONE VALERATE 0.1 % EX OINT: TOPICAL_OINTMENT | CUTANEOUS | 0 refills | Status: DC

## 2017-02-14 MED ORDER — FLUCONAZOLE 150 MG PO TABS: ORAL_TABLET | ORAL | 0 refills | Status: DC

## 2017-02-14 NOTE — Patient Instructions (Signed)
Vaginal Yeast infection, Adult Vaginal yeast infection is a condition that causes soreness, swelling, and redness (inflammation) of the vagina. It also causes vaginal discharge. This is a common condition. Some women get this infection frequently. What are the causes? This condition is caused by a change in the normal balance of the yeast (candida) and bacteria that live in the vagina. This change causes an overgrowth of yeast, which causes the inflammation. What increases the risk? This condition is more likely to develop in:  Women who take antibiotic medicines.  Women who have diabetes.  Women who take birth control pills.  Women who are pregnant.  Women who douche often.  Women who have a weak defense (immune) system.  Women who have been taking steroid medicines for a long time.  Women who frequently wear tight clothing. What are the signs or symptoms? Symptoms of this condition include:  White, thick vaginal discharge.  Swelling, itching, redness, and irritation of the vagina. The lips of the vagina (vulva) may be affected as well.  Pain or a burning feeling while urinating.  Pain during sex. How is this diagnosed? This condition is diagnosed with a medical history and physical exam. This will include a pelvic exam. Your health care provider will examine a sample of your vaginal discharge under a microscope. Your health care provider may send this sample for testing to confirm the diagnosis. How is this treated? This condition is treated with medicine. Medicines may be over-the-counter or prescription. You may be told to use one or more of the following:  Medicine that is taken orally.  Medicine that is applied as a cream.  Medicine that is inserted directly into the vagina (suppository). Follow these instructions at home:  Take or apply over-the-counter and prescription medicines only as told by your health care provider.  Do not have sex until your health care  provider has approved. Tell your sex partner that you have a yeast infection. That person should go to his or her health care provider if he or she develops symptoms.  Do not wear tight clothes, such as pantyhose or tight pants.  Avoid using tampons until your health care provider approves.  Eat more yogurt. This may help to keep your yeast infection from returning.  Try taking a sitz bath to help with discomfort. This is a warm water bath that is taken while you are sitting down. The water should only come up to your hips and should cover your buttocks. Do this 3-4 times per day or as told by your health care provider.  Do not douche.  Wear breathable, cotton underwear.  If you have diabetes, keep your blood sugar levels under control. Contact a health care provider if:  You have a fever.  Your symptoms go away and then return.  Your symptoms do not get better with treatment.  Your symptoms get worse.  You have new symptoms.  You develop blisters in or around your vagina.  You have blood coming from your vagina and it is not your menstrual period.  You develop pain in your abdomen. This information is not intended to replace advice given to you by your health care provider. Make sure you discuss any questions you have with your health care provider. Document Released: 08/10/2005 Document Revised: 04/13/2016 Document Reviewed: 05/04/2015 Elsevier Interactive Patient Education  2017 [employer].

## 2017-02-14 NOTE — Progress Notes (Signed)
GYNECOLOGY  VISIT   HPI: 36 y.o.   Married  [place]   female   [phone] with Patient's last menstrual period was 02/02/2017.   here c/o vaginal itching and discharge x 3-4 days. The discharge is white, patchy. The itching is severe. No recent antibiotics. Slight odor.     GYNECOLOGIC HISTORY: Patient's last menstrual period was 02/02/2017. Contraception:none  Menopausal hormone therapy: none        OB History    Gravida Para Term Preterm AB Living   2 1 1   1 1    SAB TAB Ectopic Multiple Live Births   1       1         Patient Active Problem List   Diagnosis Date Noted  . Chest tightness 09/11/2016  . Insomnia 09/11/2016  . Anxiety state 09/11/2016  . Loss of weight 04/21/2016  . Mild malnutrition ([place]) 04/21/2016  . Excessive flatus 01/11/2016  . Irritable bowel syndrome with diarrhea 01/11/2016  . Abdominal pain, lower 01/11/2016    Past Medical History:  Diagnosis Date  . Anxiety   . Celiac disease   . Colon polyps   . GERD (gastroesophageal reflux disease)     No past surgical history on file.  No current outpatient prescriptions on file.   No current facility-administered medications for this visit.      ALLERGIES: Gluten meal  No family history on file.  Social History   Social History  . Marital status: Married    Spouse name: N/A  . Number of children: N/A  . Years of education: N/A   Occupational History  . Not on file.   Social History Main Topics  . Smoking status: Never Smoker  . Smokeless tobacco: Never Used  . Alcohol use No  . Drug use: No  . Sexual activity: Yes    Partners: Male   Other Topics Concern  . Not on file   Social History Narrative  . No narrative on file    Review of Systems  Constitutional: Negative.   HENT: Negative.   Eyes: Negative.   Respiratory: Negative.   Cardiovascular: Negative.   Gastrointestinal: Negative.   Genitourinary:       Vaginal discharge and itching   Musculoskeletal: Negative.    Skin: Negative.   Neurological: Negative.   Endo/Heme/Allergies: Negative.   Psychiatric/Behavioral: Negative.     PHYSICAL EXAMINATION:    BP 102/60 (BP Location: Right Arm, Patient Position: Sitting, Cuff Size: Normal)   Pulse 80   Resp 14   Wt 107 lb (48.5 kg)   LMP 02/02/2017   BMI 18.95 kg/m     General appearance: alert, cooperative and appears stated age   Pelvic: External genitalia:  no lesions. Erythematous and swollen              Urethra:  normal appearing urethra with no masses, tenderness or lesions              Bartholins and Skenes: normal                 Vagina: erythematous vagina with a thick, clumpy white vaginal d/c               Cervix: no lesions  Chaperone was present for exam.  Wet prep: ? clue, no trich, +++ wbc KOH: +++ yeast PH: 5   ASSESSMENT Yeast vaginitis, ? BV    PLAN Diflucan and steroid ointment for yeast Send wet prep probe for  possible BV Return for an annual exam   An After Visit Summary was printed and given to the patient.

## 2017-02-15 LAB — WET PREP BY MOLECULAR PROBE
Candida species: DETECTED — AB
Gardnerella vaginalis: DETECTED — AB
Trichomonas vaginosis: NOT DETECTED

## 2017-02-16 ENCOUNTER — Telehealth: Payer: Self-pay

## 2017-02-16 MED ORDER — METRONIDAZOLE 500 MG PO TABS: 500.0000 mg | ORAL_TABLET | Freq: Two times a day (BID) | ORAL | 0 refills | Status: DC

## 2017-02-16 NOTE — Telephone Encounter (Signed)
-----   Message from Salvadore Dom, MD sent at 02/15/2017  5:50 PM EDT ----- The patient has already been treated for yeast, also + for BV (suspected on vaginal slides). Please call in  Flagyl 500 mg po BID x 7 days. No ETOH while on flagyl or for 24 hours after starting.

## 2017-02-16 NOTE — Telephone Encounter (Signed)
Spoke with patient's husband, Weldon Picking, okay per ROI. Advised of results as seen below from [place]. Husband is agreeable and verbalizes understanding. He will provide results to patient. Rx for Flagyl 500 mg BID x 7 days  #14 0RF sent to pharmacy on file. Avoid alcohol during treatment and 24 hours after completing medication. Don't mix with alcohol if mixed can cause severe nausea, vomiting and abdominal cramping. Husband verbalizes understanding.  Routing to provider for final review. Patient agreeable to disposition. Will close encounter.

## 2017-02-16 NOTE — Telephone Encounter (Signed)
Patients Husband is returning your call

## 2017-02-16 NOTE — Telephone Encounter (Signed)
Left message to call Kaitlyn at [phone]. 

## 2017-02-17 ENCOUNTER — Other Ambulatory Visit: Payer: Self-pay | Admitting: *Deleted

## 2017-02-17 ENCOUNTER — Encounter: Payer: Self-pay | Admitting: Obstetrics and Gynecology

## 2017-02-17 MED ORDER — METRONIDAZOLE 0.75 % VA GEL: 1.0000 | Freq: Every day | VAGINAL | 0 refills | Status: DC

## 2017-02-17 NOTE — Telephone Encounter (Signed)
See telephone encounter dated 02/17/17.

## 2017-02-17 NOTE — Telephone Encounter (Signed)
Spoke with patients spouse Dwyane Dee ok per current dpr. Was advised patient would prefer vaginal option for antibiotic instead of pill form. Spouse reports has not picked up pill antibiotic. Advised spouse would cancel oral flagyl and send metrogel, place 1 applicator full vaginally at bedtime for 5 nights. ETOH precautions reviewed. Verified pharmacy on file. Advised spouse would review with covering provider and return call with any additional recommendations, spouse is agreeable.  Dr. Quincy Simmonds, ok to send metrogel?   Notes recorded by Salvadore Dom, MD on 02/15/2017 at 5:50 PM EDT The patient has already been treated for yeast, also + for BV (suspected on vaginal slides). Please call in Flagyl 500 mg po BID x 7 days. No ETOH while on flagyl or for 24 hours after starting.  From [facility] To Salvadore Dom, MD Sent 02/17/2017 6:19 AM  Hi Dr Sharee Pimple  For bacterial infection can I use metronidazole suppository which u prescribed me earlier.Actually the oral medicine gives soo much tummy problem to me .So If possible can u prescribe suppository for it.   Thanks    Cc: Dr. Talbert Nan

## 2017-02-25 ENCOUNTER — Encounter: Payer: Self-pay | Admitting: Obstetrics and Gynecology

## 2017-02-27 ENCOUNTER — Telehealth: Payer: Self-pay | Admitting: *Deleted

## 2017-02-27 NOTE — Telephone Encounter (Signed)
Left message to call Sharee Pimple at [phone].     From [facility] To Salvadore Dom, MD Sent 02/25/2017 3:34 AM  Hi Dr Sharee Pimple  Is there any side effect of Fluconazole medicine ?actually I got some headache with 1st pill and after taking 2nd pill on 6 th of April I got more headache with little light head,very low energy,shivering and nauseated.do this medicine gives such side effect ?   Thanks

## 2017-02-27 NOTE — Telephone Encounter (Signed)
See telephone encounter dated 02/27/17.

## 2017-03-02 NOTE — Telephone Encounter (Signed)
Left message to call Nickalaus Crooke at [phone].  

## 2017-03-07 NOTE — Telephone Encounter (Signed)
She will call back if she is still having issues. Will close encounter.

## 2017-03-07 NOTE — Telephone Encounter (Signed)
Dr. Talbert Nan, have attempted to call x2 with no return call, please advise?

## 2017-03-30 ENCOUNTER — Encounter: Payer: Self-pay | Admitting: Obstetrics and Gynecology

## 2017-03-30 ENCOUNTER — Other Ambulatory Visit: Payer: Self-pay | Admitting: Obstetrics and Gynecology

## 2017-03-30 ENCOUNTER — Ambulatory Visit (INDEPENDENT_AMBULATORY_CARE_PROVIDER_SITE_OTHER): Payer: 59 | Admitting: Obstetrics and Gynecology

## 2017-03-30 VITALS — BP 110/60 | HR 64 | Resp 14 | Ht 63.25 in | Wt 108.0 lb

## 2017-03-30 DIAGNOSIS — Z01419 Encounter for gynecological examination (general) (routine) without abnormal findings: Secondary | ICD-10-CM | POA: Diagnosis not present

## 2017-03-30 DIAGNOSIS — Z0000 Encounter for general adult medical examination without abnormal findings: Secondary | ICD-10-CM

## 2017-03-30 DIAGNOSIS — K5900 Constipation, unspecified: Secondary | ICD-10-CM

## 2017-03-30 DIAGNOSIS — R1030 Lower abdominal pain, unspecified: Secondary | ICD-10-CM

## 2017-03-30 DIAGNOSIS — R140 Abdominal distension (gaseous): Secondary | ICD-10-CM

## 2017-03-30 DIAGNOSIS — L292 Pruritus vulvae: Secondary | ICD-10-CM | POA: Diagnosis not present

## 2017-03-30 DIAGNOSIS — N898 Other specified noninflammatory disorders of vagina: Secondary | ICD-10-CM

## 2017-03-30 LAB — CBC
HCT: 32.5 % — ABNORMAL LOW (ref 35.0–45.0)
Hemoglobin: 10.7 g/dL — ABNORMAL LOW (ref 11.7–15.5)
MCH: 31 pg (ref 27.0–33.0)
MCHC: 32.9 g/dL (ref 32.0–36.0)
MCV: 94.2 fL (ref 80.0–100.0)
MPV: 10.5 fL (ref 7.5–12.5)
Platelets: 261 10*3/uL (ref 140–400)
RBC: 3.45 MIL/uL — ABNORMAL LOW (ref 3.80–5.10)
RDW: 13.1 % (ref 11.0–15.0)
WBC: 5.3 10*3/uL (ref 3.8–10.8)

## 2017-03-30 LAB — COMPREHENSIVE METABOLIC PANEL
ALT: 21 U/L (ref 6–29)
AST: 21 U/L (ref 10–30)
Albumin: 4.3 g/dL (ref 3.6–5.1)
Alkaline Phosphatase: 67 U/L (ref 33–115)
BUN: 8 mg/dL (ref 7–25)
CO2: 26 mmol/L (ref 20–31)
Calcium: 9.4 mg/dL (ref 8.6–10.2)
Chloride: 107 mmol/L (ref 98–110)
Creat: 0.68 mg/dL (ref 0.50–1.10)
Glucose, Bld: 73 mg/dL (ref 65–99)
Potassium: 4.7 mmol/L (ref 3.5–5.3)
Sodium: 141 mmol/L (ref 135–146)
Total Bilirubin: 0.7 mg/dL (ref 0.2–1.2)
Total Protein: 6.8 g/dL (ref 6.1–8.1)

## 2017-03-30 LAB — LIPID PANEL
Cholesterol: 117 mg/dL (ref <200)
HDL: 39 mg/dL — ABNORMAL LOW (ref >50)
LDL Cholesterol: 66 mg/dL (ref <100)
Total CHOL/HDL Ratio: 3 Ratio (ref <5.0)
Triglycerides: 60 mg/dL (ref <150)
VLDL: 12 mg/dL (ref <30)

## 2017-03-30 NOTE — Progress Notes (Signed)
36 y.o. G38P1011 Married IndianF here for annual exam.   For the last 15 days she is having sharp pain in BLQ only prior to her BM, lasts for 2-10 seconds. Feels some bloating. The pain is a 6-7/10 in severity. No increase in gas. No change in her diet. Constipation comes and goes. Always gets constipated prior to her cycle. BM every day, sometimes more firm.  Sexually active, not actively trying to get pregnant, avoids intercourse near ovulation.  She was treated for a yeast and BV infection last month (she never took the flagyl, used a herbal product). The vaginal discharge never completely went away, still mild itching off and on.  She has some issues with reflux, helped with over the counter medication.  Period Cycle (Days): 28 Period Duration (Days): 4-5 days  Period Pattern: Regular Menstrual Flow: Moderate, Light Menstrual Control: Maxi pad Menstrual Control Change Freq (Hours): changes pad 4-5 times a day  Dysmenorrhea: (!) Moderate Dysmenorrhea Symptoms: Cramping  Patient's last menstrual period was 03/23/2017.          Sexually active: Yes.    The current method of family planning is none.    Exercising: No.  The patient does not participate in regular exercise at present. Smoker:  no  Health Maintenance: Pap:  12-24-15 WNL NEG HR HPV History of abnormal Pap:  no MMG:  Never Colonoscopy:  11-18-15 polyps  BMD:   Never TDaP:  2011 Gardasil: N/A   reports that she has never smoked. She has never used smokeless tobacco. She reports that she does not drink alcohol or use drugs. Son is 6  Past Medical History:  Diagnosis Date  . Anxiety   . Celiac disease   . Colon polyps   . GERD (gastroesophageal reflux disease)     No past surgical history on file.  Current Outpatient Prescriptions  Medication Sig Dispense Refill  . betamethasone valerate ointment (VALISONE) 0.1 % Apply a pea sized amount topically BID for 1-2 weeks as needed 15 g 0   No current facility-administered  medications for this visit.   Not using the steroid ointment currently  No family history on file.  Review of Systems  Constitutional: Negative.   HENT: Negative.   Eyes: Negative.   Respiratory: Negative.   Cardiovascular: Negative.   Gastrointestinal: Positive for abdominal pain, constipation, diarrhea and nausea.  Endocrine: Negative.   Genitourinary: Positive for vaginal discharge.       Vaginal itching  Musculoskeletal: Negative.   Skin: Negative.   Allergic/Immunologic: Negative.   Neurological: Positive for headaches.  Psychiatric/Behavioral: Negative.   Occasional external dysuria.   Exam:   BP 110/60 (BP Location: Right Arm, Patient Position: Sitting, Cuff Size: Normal)   Pulse 64   Resp 14   Ht 5' 3.25" (1.607 m)   Wt 108 lb (49 kg)   LMP 03/23/2017   BMI 18.98 kg/m   Weight change: @WEIGHTCHANGE @ Height:   Height: 5' 3.25" (160.7 cm)  Ht Readings from Last 3 Encounters:  03/30/17 5' 3.25" (1.607 m)  09/09/16 5' 3"  (1.6 m)  05/18/16 5' 3"  (1.6 m)    General appearance: alert, cooperative and appears stated age Head: Normocephalic, without obvious abnormality, atraumatic Neck: no adenopathy, supple, symmetrical, trachea midline and thyroid normal to inspection and palpation Lungs: clear to auscultation bilaterally Cardiovascular: regular rate and rhythm Breasts: normal appearance, no masses or tenderness Abdomen: soft, non-tender; bowel sounds normal; no masses,  no organomegaly Extremities: extremities normal, atraumatic, no cyanosis or  edema Skin: Skin color, texture, turgor normal. No rashes or lesions Lymph nodes: Cervical, supraclavicular, and axillary nodes normal. No abnormal inguinal nodes palpated Neurologic: Grossly normal   Pelvic: External genitalia:  no lesions              Urethra:  normal appearing urethra with no masses, tenderness or lesions              Bartholins and Skenes: normal                 Vagina: normal appearing vagina with  normal color and discharge, no lesions              Cervix: no lesions               Bimanual Exam:  Uterus:  normal size, contour, position, consistency, mobility, non-tender              Adnexa: no mass, fullness, tenderness               Rectovaginal: Confirms               Anus:  normal sphincter tone, no lesions  Chaperone was present for exam.  A:  Well Woman with normal exam  Intermittent BLQ abdominal pain, occurs prior to BM  Bloating  Intermittent constipation  Vaginal discharge, intermittent pruritus  P:   Screening labs  No pap this year  Vaginitis panel  Discussed breast self exam  Discussed calcium and vit D intake  She denies any dietary changes, I told her to f/u with GI if her symptoms don't improve in the next few weeks  If lower abdominal pain/bloating persists and GI doesn't find an etiology we discussed the option of an ultrasound (I explained to her that I think it is a low yield)

## 2017-03-30 NOTE — Patient Instructions (Signed)
EXERCISE AND DIET:  We recommended that you start or continue a regular exercise program for good health. Regular exercise means any activity that makes your heart beat faster and makes you sweat.  We recommend exercising at least 30 minutes per day at least 3 days a week, preferably 4 or 5.  We also recommend a diet low in fat and sugar.  Inactivity, poor dietary choices and obesity can cause diabetes, heart attack, stroke, and kidney damage, among others.    ALCOHOL AND SMOKING:  Women should limit their alcohol intake to no more than 7 drinks/beers/glasses of wine (combined, not each!) per week. Moderation of alcohol intake to this level decreases your risk of breast cancer and liver damage. And of course, no recreational drugs are part of a healthy lifestyle.  And absolutely no smoking or even second hand smoke. Most people know smoking can cause heart and lung diseases, but did you know it also contributes to weakening of your bones? Aging of your skin?  Yellowing of your teeth and nails?  CALCIUM AND VITAMIN D:  Adequate intake of calcium and Vitamin D are recommended.  The recommendations for exact amounts of these supplements seem to change often, but generally speaking 600 mg of calcium (either carbonate or citrate) and 800 units of Vitamin D per day seems prudent. Certain women may benefit from higher intake of Vitamin D.  If you are among these women, your doctor will have told you during your visit.    PAP SMEARS:  Pap smears, to check for cervical cancer or precancers,  have traditionally been done yearly, although recent scientific advances have shown that most women can have pap smears less often.  However, every woman still should have a physical exam from her gynecologist every 34 years. It will include a breast check, inspection of the vulva and vagina to check for abnormal growths or skin changes, a visual exam of the cervix, and then an exam to evaluate the size and shape of the uterus and  ovaries.  And after 34 years of age, a rectal exam is indicated to check for rectal cancers. We will also provide age appropriate advice regarding health maintenance, like when you should have certain vaccines, screening for sexually transmitted diseases, bone density testing, colonoscopy, mammograms, etc.   MAMMOGRAMS:  All women over 5 years old should have a yearly mammogram. Many facilities now offer a "3D" mammogram, which may cost around $50 extra out of pocket. If possible,  we recommend you accept the option to have the 3D mammogram performed.  It both reduces the number of women who will be called back for extra views which then turn out to be normal, and it is better than the routine mammogram at detecting truly abnormal areas.    COLONOSCOPY:  Colonoscopy to screen for colon cancer is recommended for all women at age 34.  We know, you hate the idea of the prep.  We agree, BUT, having colon cancer and not knowing it is worse!!  Colon cancer so often starts as a polyp that can be seen and removed at colonscopy, which can quite literally save your life!  And if your first colonoscopy is normal and you have no family history of colon cancer, most women don't have to have it again for 10 years.  Once every ten years, you can do something that may end up saving your life, right?  We will be happy to help you get it scheduled when you are ready.  Be sure to check your insurance coverage so you understand how much it will cost.  It may be covered as a preventative service at no cost, but you should check your particular policy.      Breast Self-Awareness Breast self-awareness means being familiar with how your breasts look and feel. It involves checking your breasts regularly and reporting any changes to your health care provider. Practicing breast self-awareness is important. A change in your breasts can be a sign of a serious medical problem. Being familiar with how your breasts look and feel allows  you to find any problems early, when treatment is more likely to be successful. All women should practice breast self-awareness, including women who have had breast implants. How to do a breast self-exam One way to learn what is normal for your breasts and whether your breasts are changing is to do a breast self-exam. To do a breast self-exam: Look for Changes   1. Remove all the clothing above your waist. 2. Stand in front of a mirror in a room with good lighting. 3. Put your hands on your hips. 4. Push your hands firmly downward. 5. Compare your breasts in the mirror. Look for differences between them (asymmetry), such as:  Differences in shape.  Differences in size.  Puckers, dips, and bumps in one breast and not the other. 6. Look at each breast for changes in your skin, such as:  Redness.  Scaly areas. 7. Look for changes in your nipples, such as:  Discharge.  Bleeding.  Dimpling.  Redness.  A change in position. Feel for Changes   Carefully feel your breasts for lumps and changes. It is best to do this while lying on your back on the floor and again while sitting or standing in the shower or tub with soapy water on your skin. Feel each breast in the following way:  Place the arm on the side of the breast you are examining above your head.  Feel your breast with the other hand.  Start in the nipple area and make  inch (2 cm) overlapping circles to feel your breast. Use the pads of your three middle fingers to do this. Apply light pressure, then medium pressure, then firm pressure. The light pressure will allow you to feel the tissue closest to the skin. The medium pressure will allow you to feel the tissue that is a little deeper. The firm pressure will allow you to feel the tissue close to the ribs.  Continue the overlapping circles, moving downward over the breast until you feel your ribs below your breast.  Move one finger-width toward the center of the body.  Continue to use the  inch (2 cm) overlapping circles to feel your breast as you move slowly up toward your collarbone.  Continue the up and down exam using all three pressures until you reach your armpit. Write Down What You Find   Write down what is normal for each breast and any changes that you find. Keep a written record with breast changes or normal findings for each breast. By writing this information down, you do not need to depend only on memory for size, tenderness, or location. Write down where you are in your menstrual cycle, if you are still menstruating. If you are having trouble noticing differences in your breasts, do not get discouraged. With time you will become more familiar with the variations in your breasts and more comfortable with the exam. How often should I examine my  breasts? Examine your breasts every month. If you are breastfeeding, the best time to examine your breasts is after a feeding or after using a breast pump. If you menstruate, the best time to examine your breasts is 5-7 days after your period is over. During your period, your breasts are lumpier, and it may be more difficult to notice changes. When should I see my health care provider? See your health care provider if you notice:  A change in shape or size of your breasts or nipples.  A change in the skin of your breast or nipples, such as a reddened or scaly area.  Unusual discharge from your nipples.  A lump or thick area that was not there before.  Pain in your breasts.  Anything that concerns you. This information is not intended to replace advice given to you by your health care provider. Make sure you discuss any questions you have with your health care provider. Document Released: 10/31/2005 Document Revised: 04/07/2016 Document Reviewed: 09/20/2015 Elsevier Interactive Patient Education  2017 [employer].

## 2017-03-31 ENCOUNTER — Encounter: Payer: Self-pay | Admitting: Internal Medicine

## 2017-03-31 ENCOUNTER — Telehealth: Payer: Self-pay

## 2017-03-31 ENCOUNTER — Ambulatory Visit (INDEPENDENT_AMBULATORY_CARE_PROVIDER_SITE_OTHER): Payer: 59 | Admitting: Internal Medicine

## 2017-03-31 VITALS — BP 104/60 | HR 70 | Temp 98.6°F | Resp 12 | Ht 63.25 in | Wt 106.0 lb

## 2017-03-31 DIAGNOSIS — K580 Irritable bowel syndrome with diarrhea: Secondary | ICD-10-CM

## 2017-03-31 DIAGNOSIS — R109 Unspecified abdominal pain: Secondary | ICD-10-CM | POA: Diagnosis not present

## 2017-03-31 DIAGNOSIS — K219 Gastro-esophageal reflux disease without esophagitis: Secondary | ICD-10-CM

## 2017-03-31 LAB — WET PREP BY MOLECULAR PROBE
Candida species: DETECTED — AB
Gardnerella vaginalis: DETECTED — AB
Trichomonas vaginosis: NOT DETECTED

## 2017-03-31 MED ORDER — TERCONAZOLE 0.4 % VA CREA: 1.0000 | TOPICAL_CREAM | Freq: Every day | VAGINAL | 0 refills | Status: DC

## 2017-03-31 MED ORDER — METRONIDAZOLE 0.75 % VA GEL: 1.0000 | Freq: Every day | VAGINAL | 0 refills | Status: DC

## 2017-03-31 NOTE — Telephone Encounter (Signed)
Spoke with patients spouse, "Ashigh", ok per current dpr. Advised RX for terazol 7 to pharmacy on file. Patient can return call to office with any additional questions. Spouse verbalizes understanding and is agreeable.  Routing to provider for final review. Patient is agreeable to disposition. Will close encounter.

## 2017-03-31 NOTE — Progress Notes (Signed)
   Subjective:    Patient ID: Jane Torres, female    DOB: May 13, 1981, 36 y.o.   MRN: [id number]  HPI The patient is a 36 YO female coming in for continued GI concerns. 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 She did have mild relief of her symptoms for a couple of months but it is worsening again. For the last 2 weeks or so she is having mid epigastric pain. She has gotten some medicine from [place] several times since our last visit and did a course of cipro and rifaximin back in January which did not help much. She started taking protonix about 2 days ago which has helped some. She is having stable weight since last visit but has not been able to gain weight. Was on gluten free diet for about 1 year without benefit to GI symptoms. She can eat some foods at times and others they will give her stomach pain and diarrhea symptoms. No fevers or chills. She has had colonoscopy and endoscopy in the past but they did not check for tropical sprue and biopsy with polyp with some mild colitis but nothing identifiable.   PMH, [facility], social history reviewed and updated.   Review of Systems  Constitutional: Positive for activity change, appetite change and fatigue. Negative for chills, fever and unexpected weight change.  HENT: Negative.   Eyes: Negative.   Respiratory: Negative.   Cardiovascular: Negative.   Gastrointestinal: Positive for abdominal distention, abdominal pain and diarrhea. Negative for anal bleeding, blood in stool, constipation, nausea and vomiting.  Musculoskeletal: Negative.   Skin: Negative.   Neurological: Negative.   Psychiatric/Behavioral: Negative.       Objective:   Physical Exam  Constitutional: She is oriented to person, place, and time. She appears well-developed.  thin  HENT:  Head: Normocephalic and atraumatic.  Eyes: EOM are normal.  Neck: Normal range of motion. No JVD present.    Cardiovascular: Normal rate and regular rhythm.   Pulmonary/Chest: Effort normal and breath sounds normal.  Abdominal: Soft. Bowel sounds are normal. She exhibits no distension. There is no tenderness. There is no rebound and no guarding.  Musculoskeletal: She exhibits no edema.  Lymphadenopathy:    She has no cervical adenopathy.  Neurological: She is alert and oriented to person, place, and time.  Skin: Skin is warm and dry.  Psychiatric: She has a normal mood and affect.   Vitals:   03/31/17 1550  BP: 104/60  Pulse: 70  Resp: 12  Temp: 98.6 F (37 C)  TempSrc: Oral  SpO2: 100%  Weight: 106 lb (48.1 kg)  Height: 5' 3.25" (1.607 m)      Assessment & Plan:

## 2017-03-31 NOTE — Telephone Encounter (Signed)
-----   Message from Salvadore Dom, MD sent at 03/31/2017 11:54 AM EDT ----- Please inform the patient that her vaginitis probe was + for BV and treat with flagyl (either oral or vaginal, her choice), no ETOH while on Flagyl.  Oral: Flagyl 500 mg BID x 7 days, or Vaginal: Metrogel, 1 applicator per vagina q day x 5 days. + yeast, treat with diflucan 150 mg x 1, may repeat in 72 hours if still symptomatic. #2, no refills Please inform anemic, see if you can a ferritin to her labs

## 2017-03-31 NOTE — Patient Instructions (Signed)
We will get you in with the doctor upstairs named Dr. Silverio Decamp.   Keep taking the medicine twice a day as you have been and give it another 3-4 days to see if it helps.

## 2017-03-31 NOTE — Telephone Encounter (Signed)
Ok for [employer] 7.

## 2017-03-31 NOTE — Telephone Encounter (Signed)
Spoke with patient. Advised of results as seen below from [place]. Patient is agreeable and verbalizes understanding.  Rx for Metrogel place 1 applicator vaginally q day x 5 days sent to pharmacy on file. Patient is asking if she may use Metrogel for 5 days and then have a cream for yeast infection to start after using Metrogel. Patient does not want to take oral Diflucan.  Dr.Silva, okay to send in Terazol 7 for patient to use after using Metrogel?

## 2017-04-01 LAB — FERRITIN: Ferritin: 15 ng/mL (ref 10–154)

## 2017-04-02 ENCOUNTER — Encounter: Payer: Self-pay | Admitting: Internal Medicine

## 2017-04-02 ENCOUNTER — Telehealth: Payer: Self-pay | Admitting: Obstetrics and Gynecology

## 2017-04-02 NOTE — Assessment & Plan Note (Signed)
This is the working diagnosis but unclear if this is accurate. She would like another GI evaluation as she has lost trust with prior GI provider Dr. Collene Mares. Referral placed today. ID has recommended some additional biopsies with repeat colon/endo to rule out lymphoma and tropical sprue. She is down about 40 pounds since onset of symptoms when she moved to [place] from [place] via [place].

## 2017-04-02 NOTE — Assessment & Plan Note (Signed)
Suspect main symptoms she is having today are GERD and she has started taking protonix 2 days ago and advised her to continue for another 1-2 weeks.

## 2017-04-02 NOTE — Telephone Encounter (Signed)
Jane Torres, I think you missed the portion of the below note that she has yeast. Please call in the diflucan.   Notes recorded by Salvadore Dom, MD on 03/31/2017 at 11:54 AM EDT Please inform the patient that her vaginitis probe was + for BV and treat with flagyl (either oral or vaginal, her choice), no ETOH while on Flagyl.  Oral: Flagyl 500 mg BID x 7 days, or Vaginal: Metrogel, 1 applicator per vagina q day x 5 days. + yeast, treat with diflucan 150 mg x 1, may repeat in 72 hours if still symptomatic. #2, no refills Please inform anemic, see if you can a ferritin to her labs

## 2017-04-03 ENCOUNTER — Telehealth: Payer: Self-pay | Admitting: *Deleted

## 2017-04-03 ENCOUNTER — Encounter: Payer: Self-pay | Admitting: Internal Medicine

## 2017-04-03 NOTE — Telephone Encounter (Signed)
Spoke with patient, advised of results and recommendations as seen below per Dr. Talbert Nan. Patient reports she has already picked up Terazol 7 as previously prescribed by Dr. Iva Lento telephone encounter dated 03/31/17. Patient verbalizes understanding and is agreeable.   Labs dated 03/30/17 faxed to Dr. Sharlet Salina for f/u.   Routing to provider for final review. Patient is agreeable to disposition. Will close encounter.

## 2017-04-03 NOTE — Telephone Encounter (Signed)
-----   Message from Salvadore Dom, MD sent at 04/02/2017 12:22 PM EDT ----- Please let the patient know that her ferritin level is low. This could cause anemia. I don't know if she is have absorption issues with her GI issues.  Please have her start iron, 1 po qd (slow fe might be better tolerated by her). She should f/u with her primary for further evaluation. Please send labs to her primary. Her bleeding is not heavy, it shouldn't be causing anemia.

## 2017-04-03 NOTE — Telephone Encounter (Signed)
Call to patient regarding lab results. Left message to call back. Ask for triage nurse.    Also see message copied from previous phone message from Dr Talbert Nan:  Notes recorded by Salvadore Dom, MD on 03/31/2017 at 11:54 AM EDT Please inform the patient that her vaginitis probe was + for BV and treat with flagyl (either oral or vaginal, her choice), no ETOH while on Flagyl.  Oral: Flagyl 500 mg BID x 7 days, or Vaginal: Metrogel, 1 applicator per vagina q day x 5 days. + yeast, treat with diflucan 150 mg x 1, may repeat in 72 hours if still symptomatic. #2, no refills Please inform anemic, see if you can a ferritin to her labs

## 2017-04-14 ENCOUNTER — Encounter: Payer: Self-pay | Admitting: Gastroenterology

## 2017-04-14 ENCOUNTER — Encounter: Payer: Self-pay | Admitting: Internal Medicine

## 2017-04-14 MED ORDER — DICYCLOMINE HCL 10 MG PO CAPS: 10.0000 mg | ORAL_CAPSULE | Freq: Three times a day (TID) | ORAL | 0 refills | Status: DC

## 2017-04-23 IMAGING — CT CT ABD-PELV W/ CM
2 of 4 series · 11 of 36 positions shown, 18 images · IV contrast (READICAT/WATER & [ID] ISOVUE 300)
Comparison: Ultrasound exam from 08/10/2015.

CLINICAL DATA: Initial encounter for periumbilical pain and 25
pound weight loss over the last 5 months. Some nausea constipation.

EXAM:
CT ABDOMEN AND PELVIS WITH CONTRAST
TECHNIQUE: Multidetector CT imaging of the abdomen and pelvis was performed
using the standard protocol following bolus administration of
intravenous contrast.
CONTRAST:  100mL QAS4UU-ADD IOPAMIDOL (QAS4UU-ADD) INJECTION 61%

[Series 3: abd/pelvis with · axial · 0.60mm/px · z∈[-379,-59]mm · 10 of 80 slices shown, 16 images]
[im 8/80  soft-tissue]
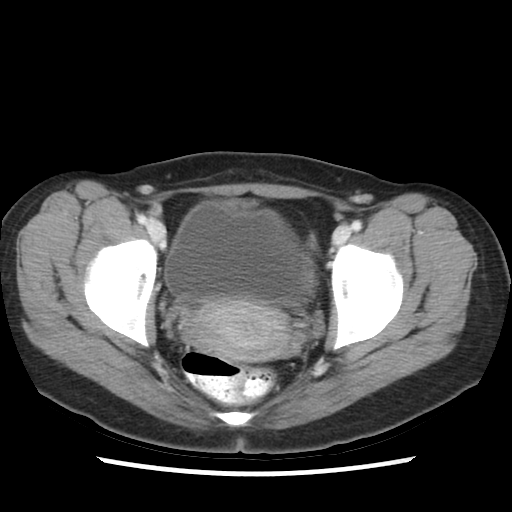
[im 8/80  bone]
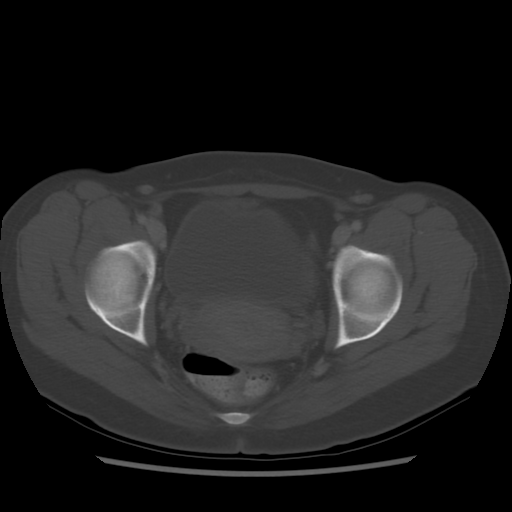
[im 15/80  soft-tissue]
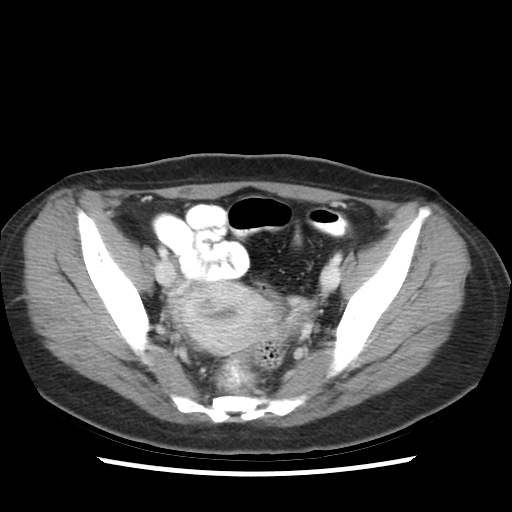
[im 22/80  soft-tissue]
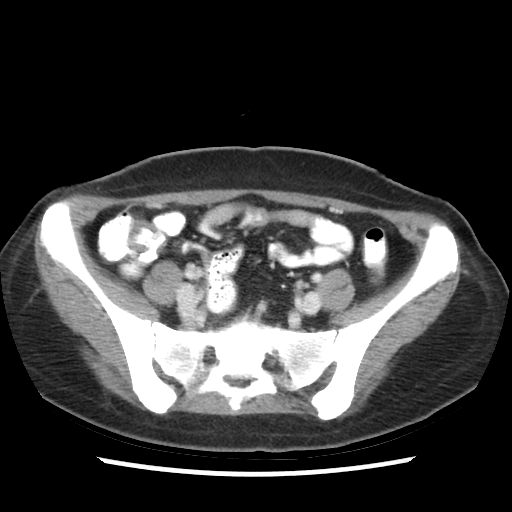
[im 29/80  soft-tissue]
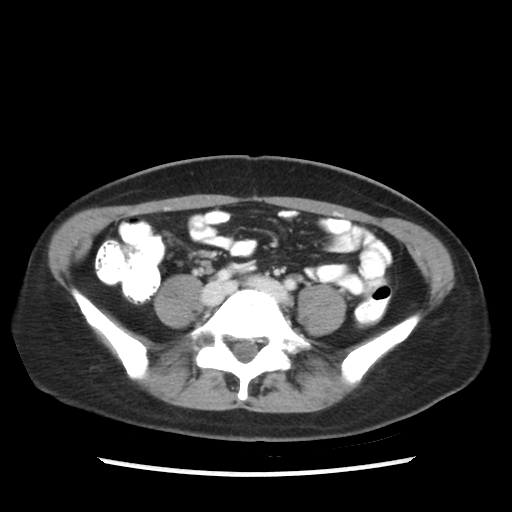
[im 36/80  soft-tissue]
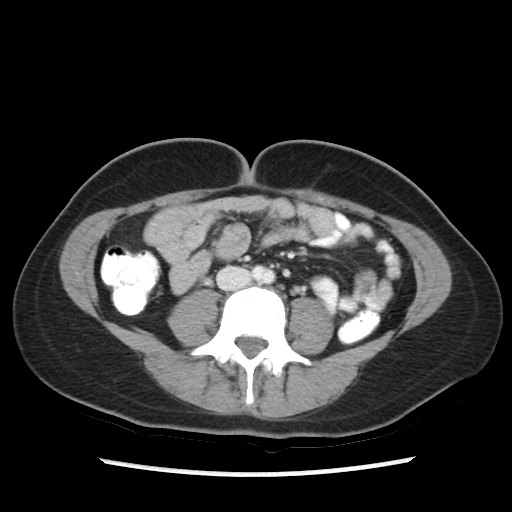
[im 44/80  soft-tissue]
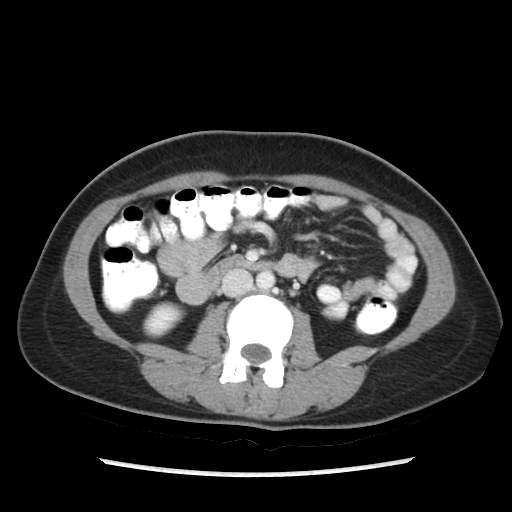
[im 51/80  soft-tissue]
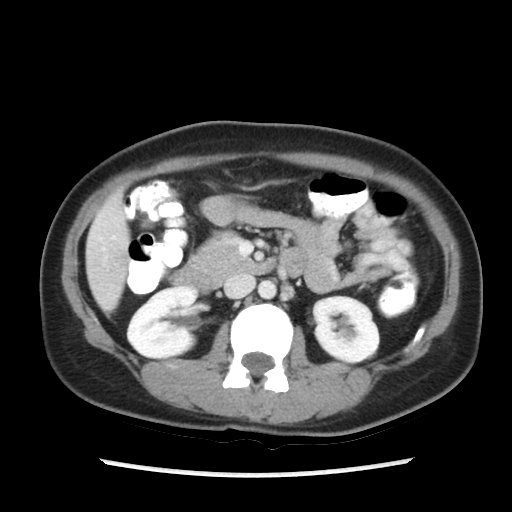
[im 51/80  lung]
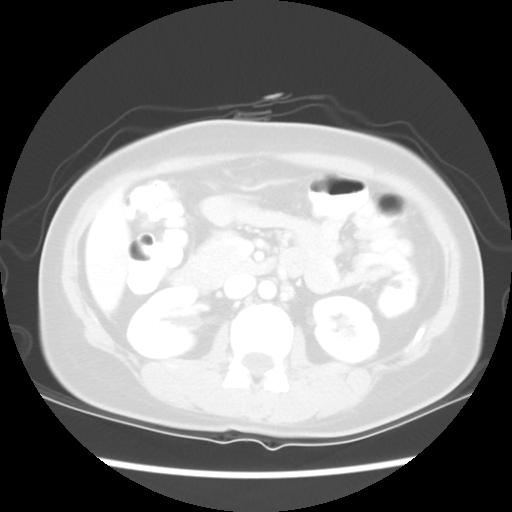
[im 58/80  soft-tissue]
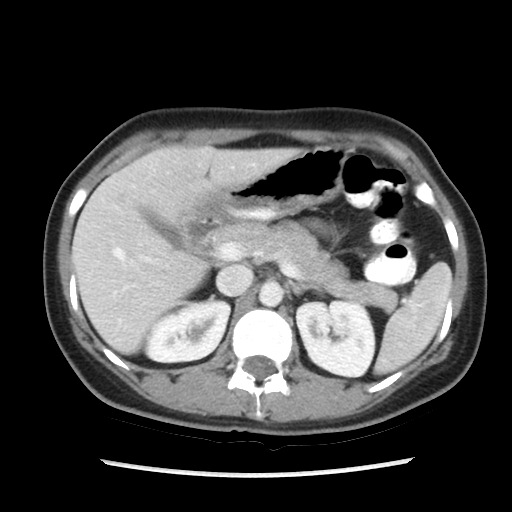
[im 58/80  lung]
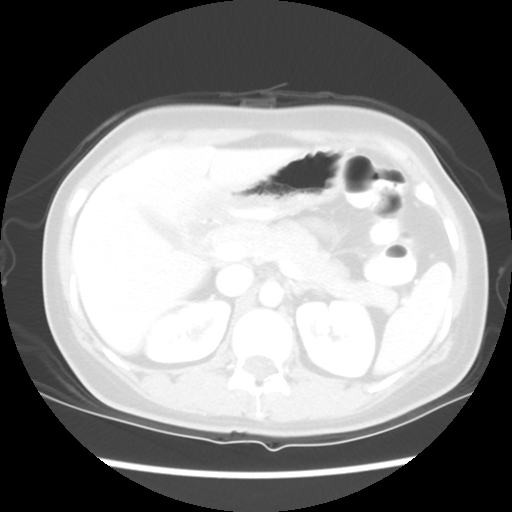
[im 65/80  soft-tissue]
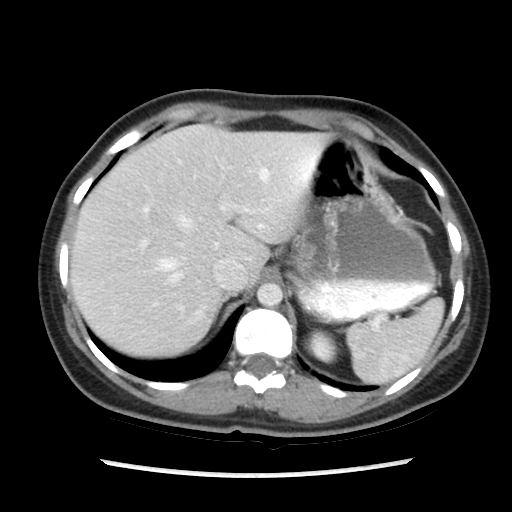
[im 65/80  lung]
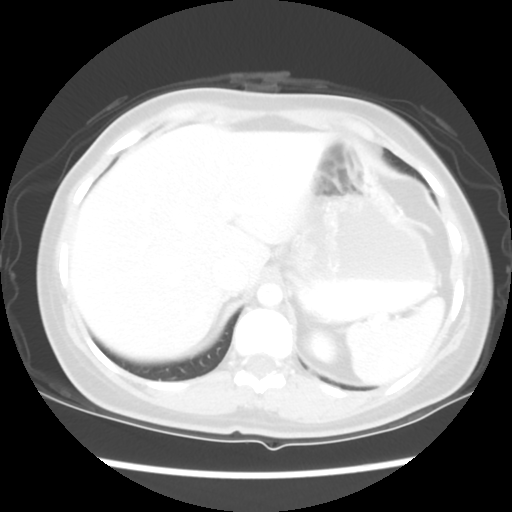
[im 65/80  bone]
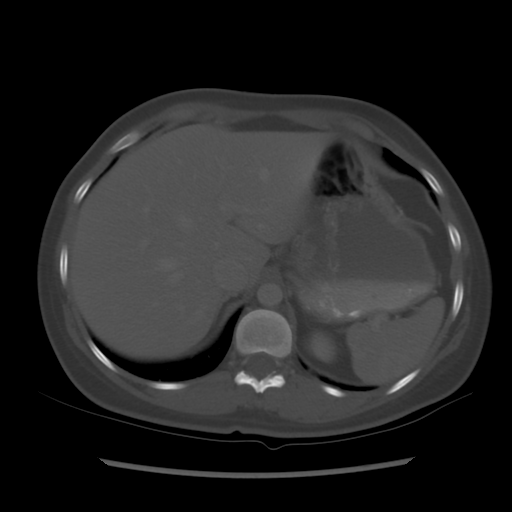
[im 72/80  soft-tissue]
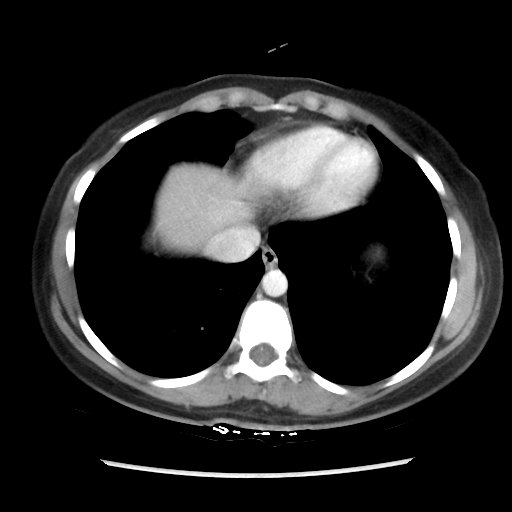
[im 72/80  lung]
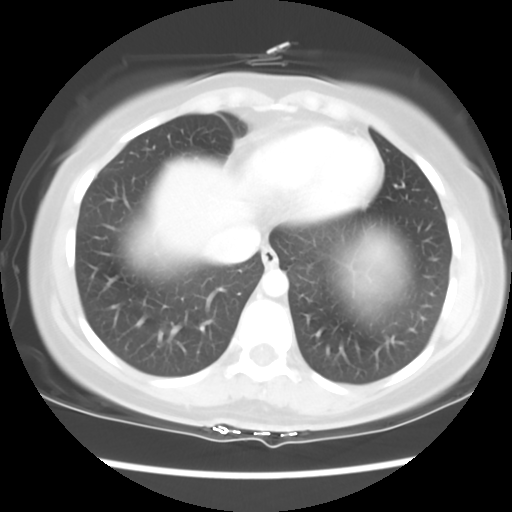

[Series 601: coronal body · coronal · 0.84mm/px · 1 of 100 slices shown, 2 images]
[im 34/100  soft-tissue]
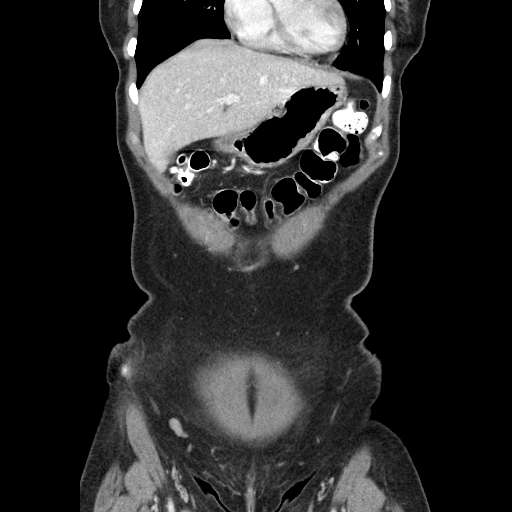
[im 34/100  bone]
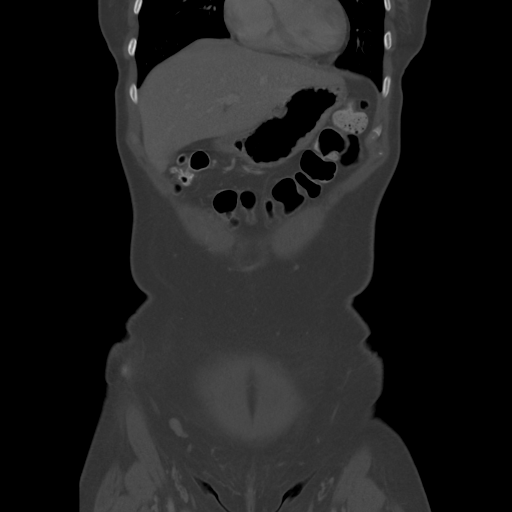

[11 of 36 positions shown; findings below may reference images not displayed]

FINDINGS: Lower chest: No focal abnormality within the liver parenchyma. There
is no evidence for gallstones, gallbladder wall thickening, or
pericholecystic fluid. No intrahepatic or extrahepatic biliary
dilation.

Hepatobiliary: No focal mass lesion. No dilatation of the main duct.
No intraparenchymal cyst. No peripancreatic edema.

Pancreas: No splenomegaly. No focal mass lesion.

Spleen: No splenomegaly. No focal mass lesion.

Adrenals/Urinary Tract: No adrenal nodule or mass. Kidneys are
unremarkable. No evidence for hydroureter. The urinary bladder
appears normal for the degree of distention.

Stomach/Bowel: Stomach is nondistended. No gastric wall thickening.
No evidence of outlet obstruction. Duodenum is normally positioned
as is the ligament of Treitz. No small bowel wall thickening. No
small bowel dilatation. The terminal ileum is normal. The appendix
is normal. No gross colonic mass. No colonic wall thickening. No
substantial diverticular change.

Vascular/Lymphatic: No abdominal aortic aneurysm. No abdominal
atherosclerotic calcification. There is no gastrohepatic or
hepatoduodenal ligament lymphadenopathy. No intraperitoneal or
retroperitoneal lymphadenopy. No pelvic sidewall lymphadenopathy.

Reproductive: 10 mm intramural right fundal fibroid noted. There is
no adnexal mass.

Other: No intraperitoneal free fluid.

Musculoskeletal: No evidence for ventral hernia. 6 mm sclerotic
focus adjacent to the right SI joint is likely a bone island.
IMPRESSION: 1. No CT findings to explain the patient's history of periumbilical
pain and weight loss.

## 2017-05-11 ENCOUNTER — Ambulatory Visit: Payer: 59 | Admitting: Gastroenterology

## 2017-05-23 ENCOUNTER — Other Ambulatory Visit (INDEPENDENT_AMBULATORY_CARE_PROVIDER_SITE_OTHER): Payer: 59

## 2017-05-23 ENCOUNTER — Encounter: Payer: Self-pay | Admitting: Gastroenterology

## 2017-05-23 ENCOUNTER — Ambulatory Visit (INDEPENDENT_AMBULATORY_CARE_PROVIDER_SITE_OTHER): Payer: 59 | Admitting: Gastroenterology

## 2017-05-23 VITALS — BP 104/60 | HR 92 | Ht 63.25 in | Wt 106.6 lb

## 2017-05-23 DIAGNOSIS — R1319 Other dysphagia: Secondary | ICD-10-CM

## 2017-05-23 DIAGNOSIS — R197 Diarrhea, unspecified: Secondary | ICD-10-CM

## 2017-05-23 DIAGNOSIS — K219 Gastro-esophageal reflux disease without esophagitis: Secondary | ICD-10-CM

## 2017-05-23 DIAGNOSIS — K602 Anal fissure, unspecified: Secondary | ICD-10-CM | POA: Diagnosis not present

## 2017-05-23 DIAGNOSIS — R634 Abnormal weight loss: Secondary | ICD-10-CM

## 2017-05-23 DIAGNOSIS — K625 Hemorrhage of anus and rectum: Secondary | ICD-10-CM

## 2017-05-23 DIAGNOSIS — R140 Abdominal distension (gaseous): Secondary | ICD-10-CM

## 2017-05-23 LAB — CBC WITH DIFFERENTIAL/PLATELET
Basophils Absolute: 0 K/uL (ref 0.0–0.1)
Basophils Relative: 0.6 % (ref 0.0–3.0)
Eosinophils Absolute: 0.1 K/uL (ref 0.0–0.7)
Eosinophils Relative: 0.8 % (ref 0.0–5.0)
HCT: 36.9 % (ref 36.0–46.0)
Hemoglobin: 12.4 g/dL (ref 12.0–15.0)
Lymphocytes Relative: 20.5 % (ref 12.0–46.0)
Lymphs Abs: 1.4 K/uL (ref 0.7–4.0)
MCHC: 33.6 g/dL (ref 30.0–36.0)
MCV: 92.1 fl (ref 78.0–100.0)
Monocytes Absolute: 0.5 K/uL (ref 0.1–1.0)
Monocytes Relative: 8.1 % (ref 3.0–12.0)
Neutro Abs: 4.7 K/uL (ref 1.4–7.7)
Neutrophils Relative %: 70 % (ref 43.0–77.0)
Platelets: 223 K/uL (ref 150.0–400.0)
RBC: 4 Mil/uL (ref 3.87–5.11)
RDW: 13.8 % (ref 11.5–15.5)
WBC: 6.7 K/uL (ref 4.0–10.5)

## 2017-05-23 LAB — IBC PANEL
Iron: 67 ug/dL (ref 42–145)
Saturation Ratios: 14.7 % — ABNORMAL LOW (ref 20.0–50.0)
Transferrin: 325 mg/dL (ref 212.0–360.0)

## 2017-05-23 LAB — FERRITIN: Ferritin: 14.6 ng/mL (ref 10.0–291.0)

## 2017-05-23 LAB — SEDIMENTATION RATE: Sed Rate: 27 mm/h — ABNORMAL HIGH (ref 0–20)

## 2017-05-23 LAB — VITAMIN B12: Vitamin B-12: 432 pg/mL (ref 211–911)

## 2017-05-23 LAB — FOLATE: Folate: 22.9 ng/mL (ref >5.9)

## 2017-05-23 MED ORDER — AMBULATORY NON FORMULARY MEDICATION
2 refills | Status: DC
Start: 2017-05-23 — End: 2017-08-08

## 2017-05-23 MED ORDER — NA SULFATE-K SULFATE-MG SULF 17.5-3.13-1.6 GM/177ML PO SOLN: 1.0000 | Freq: Once | ORAL | 0 refills | Status: AC

## 2017-05-23 NOTE — Progress Notes (Signed)
Jane Torres    [id number]    24-May-1981  Primary Care Physician:Crawford, Real Cons, MD  Referring Physician: Hoyt Koch, MD [place], [place] [id number]  Chief complaint:  Generalized abdominal pain, weight loss, dysphagia, constipation alternating with diarrhea  HPI: 36 year old female here for new patient visit. She previously was seen by Dr. Collene Mares and also by Dr. Carlton Adam at [facility]. She has had extensive GI workup as outlined in history of present illness and also below in data reviewed. She has been having intermittent symptoms for the past 2 years. Generalized abdominal pain associated with fullness and bloating. She sometimes has constipation with hard stool alternating with loose semi-formed stool. She can have anywhere from 0-3 bowel movements daily. She is avoiding lactose and gluten. She feels her symptoms are worse when she tries to eat gluten.She also has intermittent episodes of bright red blood per rectum and also excessive mucus discharge. Sometimes blood is mixed in stool. 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 Patient also complains of intermittent solid food dysphagia, she feels food gets hung up in her lower chest.  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 She is on PPI twice daily with improvement of heartburn. She has been anemic with iron deficiency and is taking oral iron.  She has lost over 30 pounds in the past 2 years and has been unable to gain weight. 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  She was treated empirically with rifaximin for small intestinal bacterial overgrowth and also probiotic lactobacillus with no improvement. No significant improvement with LevBID.     Outpatient Encounter Prescriptions as of 05/23/2017  Medication Sig  .  betamethasone valerate ointment (VALISONE) 0.1 % Apply a pea sized amount topically BID for 1-2 weeks as needed  . dicyclomine (BENTYL) 10 MG capsule Take 1 capsule (10 mg total) by mouth 4 (four) times daily -  before meals and at bedtime.  . Ferrous Sulfate (SLOW FE) 142 (45 Fe) MG TBCR Take 1 tablet by mouth at bedtime.  . metroNIDAZOLE (METROGEL) 0.75 % vaginal gel Place 1 Applicatorful vaginally at bedtime. X 5 days  . pantoprazole (PROTONIX) 40 MG tablet Take 40 mg by mouth 2 (two) times daily.  Marland Kitchen terconazole (TERAZOL 7) 0.4 % vaginal cream Place 1 applicator vaginally at bedtime.  . AMBULATORY NON FORMULARY MEDICATION Medication Name: Nitroglycerin ointment 0.125% 2-3 times daily per pea sized amount per rectum for 1-2 months  . Na Sulfate-K Sulfate-Mg Sulf (SUPREP BOWEL PREP KIT) 17.5-3.13-1.6 GM/180ML SOLN Take 1 kit by mouth once.   No facility-administered encounter medications on file as of 05/23/2017.     Allergies as of 05/23/2017 - Review Complete 04/02/2017  Allergen Reaction Noted  . Gluten meal Nausea And Vomiting 11/05/2015    Past Medical History:  Diagnosis Date  . Anxiety   . Celiac disease   . Colon polyps   . GERD (gastroesophageal reflux disease)     No past surgical history on file.  No family history on file.  Social History   Social History  . Marital status: Married    Spouse name: N/A  . Number of children: 1  . Years of education: N/A   Occupational History  . Not on file.  Social History Main Topics  . Smoking status: Never Smoker  . Smokeless tobacco: Never Used  . Alcohol use No  . Drug use: No  . Sexual activity: Yes    Partners: Male    Birth control/ protection: None   Other Topics Concern  . Not on file   Social History Narrative  . No narrative on file      Review of systems: Review of Systems  Constitutional: Negative for fever and chills.  HENT: Negative.   Eyes: Negative for blurred vision.  Respiratory: Negative  for cough, shortness of breath and wheezing.   Cardiovascular: Negative for chest pain and palpitations.  Gastrointestinal: as per HPI Genitourinary: Negative for dysuria, urgency, frequency and hematuria.  Musculoskeletal: Negative for myalgias, back pain and joint pain.  Skin: Negative for itching and rash.  Neurological: Negative for dizziness, tremors, focal weakness, seizures and loss of consciousness.  Endo/Heme/Allergies: Positive for seasonal allergies.  Psychiatric/Behavioral: Negative for depression, suicidal ideas and hallucinations.  All other systems reviewed and are negative.   Physical Exam: Vitals:   05/23/17 0835  BP: 104/60  Pulse: 92   Body mass index is 18.73 kg/m. Gen:      No acute distress HEENT:  EOMI, sclera anicteric Neck:     No masses; no thyromegaly Lungs:    Clear to auscultation bilaterally; normal respiratory effort CV:         Regular rate and rhythm; no murmurs Abd:      + bowel sounds; soft, non-tender; no palpable masses, no distension Ext:    No edema; adequate peripheral perfusion Skin:      Warm and dry; no rash Neuro: alert and oriented x 3 Psych: normal mood and affect Rectal exam: Increased anal sphincter tone, no anal fissure or external hemorrhoids Anoscopy: Small internal hemorrhoids, no active bleeding, normal dentate line, no visible nodules  Data Reviewed:  Reviewed labs, radiology imaging, old records and pertinent past GI work up EGD September 2016 Scalloped mucosa and duodenum, biopsy showed increased intraepithelial lymphocytes with no villous blunting. Esophagus and stomach was normal  Colonoscopy January 2017: Solitary ulcer in sigmoid, pathology showed crypt distortion with acute cryptitis and an inflammatory polyp was removed from . Rectosigmoid. Terminal ileum appeared normal  Abdominal ultrasound September 2016 : Unremarkable exam   HIDA scan September 2016: Normal  CT abdomen and pelvis with contrast December  2016: Unremarkable with no acute findings   H. pylori : Negative   Gastric emptying scan March 2017: Within normal limits, ?rapid emptying in 1st 2 hours  HBT (dextrose) Negative  Assessment and Plan/Recommendations:  36 year old female with multiple GI complaints including heartburn, generalized abdominal pain, bloating, abdominal fullness, constipation alternating with diarrhea, celiac disease, history of anal fissure, weight loss and blood per rectum.  Heartburn and GERD: Continue PPI twice daily  Antireflux measures and lifestyle modification  Dysphagia: We'll need to exclude esophageal stricture Schedule for repeat EGD with biopsies to evaluate  ? Celiac disease: H LA DQ 2 positive and evidence of increased intraepithelial lymphocytes on EGD in 2016 . She is currently avoiding gluten and continues to have persistent symptoms  She has also had Giardiasis and was treated twice. We'll check stool Giardia to document eradication   Intermittent diarrhea and blood per rectum Evidence of crypt distortion and cryptitis, inflammatory polyp in 2016 concerning for possible IBD She also has history of recurrent anal fissure Will schedule for colonoscopy with biopsies to evaluate  Anal fissure: Switch to rectal  nitroglycerin 0.125% as she is having headaches with 0.2% nitroglycerin Apply pea size amount 3 times daily Increase fluid intake and dietary fiber    Damaris Hippo , MD [phone] Mon-Fri 8a-5p [phone] after 5p, weekends, holidays  CC: Hoyt Koch, *

## 2017-05-23 NOTE — Patient Instructions (Addendum)
Go to the basement for labs today  You have been scheduled for an endoscopy and colonoscopy. Please follow the written instructions given to you at your visit today. Please pick up your prep supplies at the pharmacy within the next 1-3 days. If you use inhalers (even only as needed), please bring them with you on the day of your procedure. Your physician has requested that you go to www.startemmi.com and enter the access code given to you at your visit today. This web site gives a general overview about your procedure. However, you should still follow specific instructions given to you by our office regarding your preparation for the procedure.  We are giving you a printed prescription of Nitroglycerin ointment to take to [facility]   Follow up in 3 months

## 2017-05-26 LAB — HIGH SENSITIVITY CRP: CRP, High Sensitivity: 0.48 mg/L (ref 0.000–5.000)

## 2017-05-29 ENCOUNTER — Other Ambulatory Visit: Payer: 59

## 2017-05-29 DIAGNOSIS — R634 Abnormal weight loss: Secondary | ICD-10-CM

## 2017-05-29 DIAGNOSIS — R1319 Other dysphagia: Secondary | ICD-10-CM

## 2017-05-29 DIAGNOSIS — R197 Diarrhea, unspecified: Secondary | ICD-10-CM

## 2017-05-29 DIAGNOSIS — K219 Gastro-esophageal reflux disease without esophagitis: Secondary | ICD-10-CM

## 2017-05-29 DIAGNOSIS — K625 Hemorrhage of anus and rectum: Secondary | ICD-10-CM

## 2017-05-29 DIAGNOSIS — R140 Abdominal distension (gaseous): Secondary | ICD-10-CM

## 2017-06-01 ENCOUNTER — Encounter: Payer: Self-pay | Admitting: Obstetrics and Gynecology

## 2017-06-01 LAB — GIARDIA/CRYPTOSPORIDIUM (EIA)

## 2017-06-02 ENCOUNTER — Telehealth: Payer: Self-pay | Admitting: *Deleted

## 2017-06-02 NOTE — Telephone Encounter (Signed)
See telephone encounter dated 06/02/17.

## 2017-06-02 NOTE — Telephone Encounter (Signed)
Spoke with patient. Reports vaginal itching, burning and moderate amount of white vaginal discharge. Denies odor. Reports comes and goes, mostly after intercourse and menses. Recommended OV for further evaluation, patient declined at this time. Patient states she will discuss with her spouse and return call on Monday for scheduling. Patient verbalizes understanding and is agreeable.  Routing to provider for final review. Patient is agreeable to disposition. Will close encounter.     Non-Urgent Medical Question  Message [phone]  From [place] To Salvadore Dom, MD Sent 06/01/2017 9:39 AM  Hi Dr  My vaginal yeast infection still coming back after using the medications.For some days it goes and again comes especially after intercourse and after during period.   Thanks

## 2017-06-05 ENCOUNTER — Telehealth: Payer: Self-pay | Admitting: Obstetrics and Gynecology

## 2017-06-05 NOTE — Telephone Encounter (Signed)
Patient sent mychart message requesting an appointment today for a vaginal infection.

## 2017-06-05 NOTE — Telephone Encounter (Signed)
Spoke with patient's husband Dwyane Dee, okay per ROI. Dwyane Dee states that the patient feels she has a vaginal infection and is requesting an appointment. All appointment times offered for today with Dr.Jertson patient's spouse declines due to scheduling conflicts. Appointment scheduled for tomorrow 06/06/2017 at 1:15 pm with Dr.Jertson. Advised if patient would like to be seen earlier for evaluation to contact the office for earlier appointment or may seek urgent care if needed.  Routing to provider for final review. Patient agreeable to disposition. Will close encounter.

## 2017-06-06 ENCOUNTER — Encounter: Payer: Self-pay | Admitting: Obstetrics and Gynecology

## 2017-06-06 ENCOUNTER — Ambulatory Visit (INDEPENDENT_AMBULATORY_CARE_PROVIDER_SITE_OTHER): Payer: 59 | Admitting: Obstetrics and Gynecology

## 2017-06-06 VITALS — BP 100/60 | HR 64 | Resp 14 | Wt 109.0 lb

## 2017-06-06 DIAGNOSIS — N760 Acute vaginitis: Secondary | ICD-10-CM

## 2017-06-06 DIAGNOSIS — B373 Candidiasis of vulva and vagina: Secondary | ICD-10-CM | POA: Diagnosis not present

## 2017-06-06 DIAGNOSIS — B9689 Other specified bacterial agents as the cause of diseases classified elsewhere: Secondary | ICD-10-CM | POA: Diagnosis not present

## 2017-06-06 DIAGNOSIS — B3731 Acute candidiasis of vulva and vagina: Secondary | ICD-10-CM

## 2017-06-06 MED ORDER — FLUCONAZOLE 150 MG PO TABS: 150.0000 mg | ORAL_TABLET | Freq: Once | ORAL | 0 refills | Status: AC

## 2017-06-06 MED ORDER — METRONIDAZOLE 0.75 % VA GEL: 1.0000 | Freq: Every day | VAGINAL | 0 refills | Status: DC

## 2017-06-06 NOTE — Progress Notes (Signed)
GYNECOLOGY  VISIT   HPI: 36 y.o.   Married  [place]  female   [phone] with Patient's last menstrual period was 05/15/2017.   here c/o vaginal itching and discharge X 2 months She was treated for yeast and BV in 4/18, never took the flagyl, took an herbal product. When she was seen for her annual exam she was still + for yeast and BV. She used metrogel and terazol cream. She then used an over the counter suppository for itching, increased d/c. Symptoms never completely went away. Two days ago she has had some external itching and used the cream externally that came with miconazole suppositories (she didn't use the suppositories). She c/o burning with intercourse, stopped when she started using condoms.  Today she c/o a thick yellow/white vaginal d/c. Currently itching, burning or irritation. No odor.  Every time she goes to the bathroom she washes with water or V wash ([place] product).  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  She is struggling with constipation and has a f/u with GI. Stool is firm, but coming daily. She has an anal fissure. She is using the psyllium husk.   GYNECOLOGIC HISTORY: Patient's last menstrual period was 05/15/2017. Contraception:none  Menopausal hormone therapy: none        OB History    Gravida Para Term Preterm AB Living   2 1 1   1 1    SAB TAB Ectopic Multiple Live Births   1       1         Patient Active Problem List   Diagnosis Date Noted  . GERD (gastroesophageal reflux disease) 09/11/2016  . Insomnia 09/11/2016  . Anxiety state 09/11/2016  . Loss of weight 04/21/2016  . Mild malnutrition ([place]) 04/21/2016  . Excessive flatus 01/11/2016  . Irritable bowel syndrome with diarrhea 01/11/2016  . Abdominal pain, lower 01/11/2016    Past Medical History:  Diagnosis Date  . Anxiety   . Celiac disease   . Colon polyps   . GERD (gastroesophageal reflux disease)     No past surgical history on file.  Current Outpatient Prescriptions   Medication Sig Dispense Refill  . AMBULATORY NON FORMULARY MEDICATION Medication Name: Nitroglycerin ointment 0.125% 2-3 times daily per pea sized amount per rectum for 1-2 months 30 g 2  . betamethasone valerate ointment (VALISONE) 0.1 % Apply a pea sized amount topically BID for 1-2 weeks as needed 15 g 0  . dicyclomine (BENTYL) 10 MG capsule Take 1 capsule (10 mg total) by mouth 4 (four) times daily -  before meals and at bedtime. 60 capsule 0  . Ferrous Sulfate (SLOW FE) 142 (45 Fe) MG TBCR Take 1 tablet by mouth at bedtime.    . pantoprazole (PROTONIX) 40 MG tablet Take 40 mg by mouth 2 (two) times daily.     No current facility-administered medications for this visit.      ALLERGIES: Gluten meal  No family history on file.  Social History   Social History  . Marital status: Married    Spouse name: N/A  . Number of children: 1  . Years of education: N/A   Occupational History  . Not on file.   Social History Main Topics  . Smoking status: Never Smoker  . Smokeless tobacco: Never Used  . Alcohol use No  . Drug use: No  . Sexual activity: Yes    Partners: Male    Birth control/ protection: None   Other Topics Concern  .  Not on file   Social History Narrative  . No narrative on file    Review of Systems  Constitutional: Negative.   HENT: Negative.   Eyes: Negative.   Respiratory: Negative.   Cardiovascular: Negative.   Gastrointestinal: Negative.   Genitourinary:       Vaginal itching and discharge   Musculoskeletal: Negative.   Skin: Negative.   Neurological: Negative.   Endo/Heme/Allergies: Negative.   Psychiatric/Behavioral: Negative.     PHYSICAL EXAMINATION:    BP 100/60 (BP Location: Right Arm, Patient Position: Sitting, Cuff Size: Normal)   Pulse 64   Resp 14   Wt 109 lb (49.4 kg)   LMP 05/15/2017   BMI 19.16 kg/m     General appearance: alert, cooperative and appears stated age  Pelvic: External genitalia:  no lesions               Urethra:  normal appearing urethra with no masses, tenderness or lesions              Bartholins and Skenes: normal                 Vagina: normal appearing vagina with slight erythema and a slightly increased watery, white vaginal d/c. No odor noted.              Cervix: no lesions  Chaperone was present for exam.  Wet prep: ++ clue, no trich, rare wbc KOH: few yeast PH: 5.5   ASSESSMENT Recurrent BV and rare yeast seen, she feels her symptoms are more of yeast than BV. She is very hesitant to take oral medication for BV. This is her 3rd infection of each since 3/18, but the first time she wasn't adequately treated    PLAN Will call in diflucan x 2 (she will try OTC miconazole first, but wants the script) Metrogel x 5 days We discussed continuing to use condoms to decrease recurrence of BV We discussed the possibility of suppression of yeast and BV if they continue to occur   An After Visit Summary was printed and given to the patient.  25 minutes face to face time of which over 50% was spent in counseling.

## 2017-06-06 NOTE — Patient Instructions (Signed)

## 2017-06-12 ENCOUNTER — Encounter: Payer: Self-pay | Admitting: Gastroenterology

## 2017-06-17 ENCOUNTER — Encounter: Payer: Self-pay | Admitting: Internal Medicine

## 2017-06-19 NOTE — Telephone Encounter (Signed)
Routing to to [employer] are you okay with refilling or do you need an office visit? Please advise in Dr. Charlynne Cousins absence, thanks

## 2017-06-26 ENCOUNTER — Ambulatory Visit (AMBULATORY_SURGERY_CENTER): Payer: 59 | Admitting: Gastroenterology

## 2017-06-26 ENCOUNTER — Encounter: Payer: Self-pay | Admitting: Gastroenterology

## 2017-06-26 VITALS — BP 99/64 | HR 60 | Temp 99.6°F | Resp 12 | Ht 63.25 in | Wt 109.0 lb

## 2017-06-26 DIAGNOSIS — R197 Diarrhea, unspecified: Secondary | ICD-10-CM

## 2017-06-26 DIAGNOSIS — K228 Other specified diseases of esophagus: Secondary | ICD-10-CM

## 2017-06-26 DIAGNOSIS — D120 Benign neoplasm of cecum: Secondary | ICD-10-CM

## 2017-06-26 DIAGNOSIS — R1310 Dysphagia, unspecified: Secondary | ICD-10-CM

## 2017-06-26 MED ORDER — SODIUM CHLORIDE 0.9 % IV SOLN: 500.0000 mL | INTRAVENOUS | Status: DC

## 2017-06-26 NOTE — Progress Notes (Signed)
Report given to PACU, vss 

## 2017-06-26 NOTE — Patient Instructions (Signed)
Impression/Recommendations:  Polyp handout given to patient. Hemorrhoid handout given to patient.  Resume previous diet. Continue present medications.  Await pathology results.  Repeat colonoscopy for surveillance.  Date to be determined after pathology results are reviewed.  Return to GI clinic as needed.  YOU HAD AN ENDOSCOPIC PROCEDURE TODAY AT [facility]:   Refer to the procedure report that was given to you for any specific questions about what was found during the examination.  If the procedure report does not answer your questions, please call your gastroenterologist to clarify.  If you requested that your care partner not be given the details of your procedure findings, then the procedure report has been included in a sealed envelope for you to review at your convenience later.  YOU SHOULD EXPECT: Some feelings of bloating in the abdomen. Passage of more gas than usual.  Walking can help get rid of the air that was put into your GI tract during the procedure and reduce the bloating. If you had a lower endoscopy (such as a colonoscopy or flexible sigmoidoscopy) you may notice spotting of blood in your stool or on the toilet paper. If you underwent a bowel prep for your procedure, you may not have a normal bowel movement for a few days.  Please Note:  You might notice some irritation and congestion in your nose or some drainage.  This is from the oxygen used during your procedure.  There is no need for concern and it should clear up in a day or so.  SYMPTOMS TO REPORT IMMEDIATELY:   Following lower endoscopy (colonoscopy or flexible sigmoidoscopy):  Excessive amounts of blood in the stool  Significant tenderness or worsening of abdominal pains  Swelling of the abdomen that is new, acute  Fever of 100F or higher   Following upper endoscopy (EGD)  Vomiting of blood or coffee ground material  New chest pain or pain under the shoulder blades  Painful or  persistently difficult swallowing  New shortness of breath  Fever of 100F or higher  Black, tarry-looking stools  For urgent or emergent issues, a gastroenterologist can be reached at any hour by calling [phone].   DIET:  We do recommend a small meal at first, but then you may proceed to your regular diet.  Drink plenty of fluids but you should avoid alcoholic beverages for 24 hours.  ACTIVITY:  You should plan to take it easy for the rest of today and you should NOT DRIVE or use heavy machinery until tomorrow (because of the sedation medicines used during the test).    FOLLOW UP: Our staff will call the number listed on your records the next business day following your procedure to check on you and address any questions or concerns that you may have regarding the information given to you following your procedure. If we do not reach you, we will leave a message.  However, if you are feeling well and you are not experiencing any problems, there is no need to return our call.  We will assume that you have returned to your regular daily activities without incident.  If any biopsies were taken you will be contacted by phone or by letter within the next 1-3 weeks.  Please call us at [phone] if you have not heard about the biopsies in 3 weeks.    SIGNATURES/CONFIDENTIALITY: You and/or your care partner have signed paperwork which will be entered into your electronic medical record.  These signatures attest to  the fact that that the information above on your After Visit Summary has been reviewed and is understood.  Full responsibility of the confidentiality of this discharge information lies with you and/or your care-partner.

## 2017-06-26 NOTE — Progress Notes (Signed)
Called to room to assist during endoscopic procedure.  Patient ID and intended procedure confirmed with present staff. Received instructions for my participation in the procedure from the performing physician.  

## 2017-06-26 NOTE — Op Note (Signed)
[facility] Patient Name: Jane Torres Procedure Date: 06/26/2017 3:17 PM MRN: [id number] Endoscopist: Mauri Pole , MD Age: 36 Referring MD:  Date of Birth: 20-Sep-1981 Gender: Female Account #: [id number] Procedure:                Upper GI endoscopy Indications:              Dysphagia Medicines:                Monitored Anesthesia Care Procedure:                Pre-Anesthesia Assessment:                           - Prior to the procedure, a History and Physical                            was performed, and patient medications and                            allergies were reviewed. The patient's tolerance of                            previous anesthesia was also reviewed. The risks                            and benefits of the procedure and the sedation                            options and risks were discussed with the patient.                            All questions were answered, and informed consent                            was obtained. Prior Anticoagulants: The patient has                            taken no previous anticoagulant or antiplatelet                            agents. ASA Grade Assessment: II - A patient with                            mild systemic disease. After reviewing the risks                            and benefits, the patient was deemed in                            satisfactory condition to undergo the procedure.                           After obtaining informed consent, the endoscope was  passed under direct vision. Throughout the                            procedure, the patient's blood pressure, pulse, and                            oxygen saturations were monitored continuously. The                            Endoscope was introduced through the mouth, and                            advanced to the second part of duodenum. The upper                            GI endoscopy was accomplished without  difficulty.                            The patient tolerated the procedure well. Scope In: Scope Out: Findings:                 Mucosal changes including small-caliber esophagus,                            congestion (edema), longitudinal markings,                            punctuate white spots and vertical lines were found                            in the entire esophagus. Esophageal findings were                            graded using the Eosinophilic Esophagitis                            Endoscopic Reference Score (EoE-EREFS) as: Edema                            Grade 1 Present (decreased clarity or absence of                            vascular markings), Rings Grade 0 None (no ridges                            or rings seen), Exudates Grade 1 Mild (scattered                            white lesions involving less than 10 percent of the                            esophageal surface area), Furrows Grade 1 Present                            (  vertical lines with or without visible depth) and                            Stricture none (no stricture found). Biopsies were                            obtained from the proximal and distal esophagus                            with cold forceps for histology of suspected                            eosinophilic esophagitis.                           The stomach was normal.                           The first portion of the duodenum and second                            portion of the duodenum were normal. Biopsies were                            taken with a cold forceps for histology. Complications:            No immediate complications. Estimated Blood Loss:     Estimated blood loss was minimal. Impression:               - Esophageal mucosal changes suspicious for                            eosinophilic esophagitis. Biopsied.                           - Normal stomach.                           - Normal first portion of the duodenum and  second                            portion of the duodenum. Biopsied. Recommendation:           - Resume previous diet.                           - Continue present medications.                           - Await pathology results.                           - Patient has a contact number available for                            emergencies. The signs and symptoms of potential  delayed complications were discussed with the                            patient. Return to normal activities tomorrow.                            Written discharge instructions were provided to the                            patient.                           - Patient has a contact number available for                            emergencies. The signs and symptoms of potential                            delayed complications were discussed with the                            patient. Return to normal activities tomorrow.                            Written discharge instructions were provided to the                            patient.                           - Patient has a contact number available for                            emergencies. The signs and symptoms of potential                            delayed complications were discussed with the                            patient. Return to normal activities tomorrow.                            Written discharge instructions were provided to the                            patient. Mauri Pole, MD 06/26/2017 3:57:05 PM This report has been signed electronically.

## 2017-06-26 NOTE — Op Note (Signed)
[facility] Patient Name: Jane Torres Procedure Date: 06/26/2017 3:17 PM MRN: [id number] Endoscopist: Mauri Pole , MD Age: 36 Referring MD:  Date of Birth: [birth year] Gender: Female Account #: [id number] Procedure:                Colonoscopy Indications:              Clinically significant diarrhea of unexplained                            origin Medicines:                Monitored Anesthesia Care Procedure:                Pre-Anesthesia Assessment:                           - Prior to the procedure, a History and Physical                            was performed, and patient medications and                            allergies were reviewed. The patient's tolerance of                            previous anesthesia was also reviewed. The risks                            and benefits of the procedure and the sedation                            options and risks were discussed with the patient.                            All questions were answered, and informed consent                            was obtained. Prior Anticoagulants: The patient has                            taken no previous anticoagulant or antiplatelet                            agents. ASA Grade Assessment: II - A patient with                            mild systemic disease. After reviewing the risks                            and benefits, the patient was deemed in                            satisfactory condition to undergo the procedure.  After obtaining informed consent, the colonoscope                            was passed under direct vision. Throughout the                            procedure, the patient's blood pressure, pulse, and                            oxygen saturations were monitored continuously. The                            Colonoscope was introduced through the anus and                            advanced to the the terminal ileum, with                   identification of the appendiceal orifice and IC                            valve. The colonoscopy was performed without                            difficulty. The patient tolerated the procedure                            well. The quality of the bowel preparation was                            excellent. The terminal ileum, ileocecal valve,                            appendiceal orifice, and rectum were photographed. Scope In: 3:31:52 PM Scope Out: 3:51:52 PM Scope Withdrawal Time: 0 hours 14 minutes 48 seconds  Total Procedure Duration: 0 hours 20 minutes 0 seconds  Findings:                 The perianal and digital rectal examinations were                            normal.                           A 3 mm polyp was found in the cecum. The polyp was                            sessile. The polyp was removed with a cold snare.                            Resection and retrieval were complete.                           The colon (entire examined portion) appeared  normal. Biopsies were taken with a cold forceps for                            histology.                           The terminal ileum appeared normal. Biopsies were                            taken with a cold forceps for histology.                           Non-bleeding internal hemorrhoids were found during                            retroflexion. The hemorrhoids were small. Complications:            No immediate complications. Estimated Blood Loss:     Estimated blood loss was minimal. Impression:               - One 3 mm polyp in the cecum, removed with a cold                            snare. Resected and retrieved.                           - The entire examined colon is normal. Biopsied.                           - The examined portion of the ileum was normal.                            Biopsied.                           - Non-bleeding internal hemorrhoids. Recommendation:            - Patient has a contact number available for                            emergencies. The signs and symptoms of potential                            delayed complications were discussed with the                            patient. Return to normal activities tomorrow.                            Written discharge instructions were provided to the                            patient.                           - Resume previous diet.                           -  Continue present medications.                           - Await pathology results.                           - Repeat colonoscopy date to be determined after                            pending pathology results are reviewed for                            surveillance based on pathology results.                           - Return to GI clinic PRN. Mauri Pole, MD 06/26/2017 3:59:44 PM This report has been signed electronically.

## 2017-06-27 ENCOUNTER — Telehealth: Payer: Self-pay

## 2017-06-27 ENCOUNTER — Other Ambulatory Visit: Payer: Self-pay

## 2017-06-27 MED ORDER — LIDOCAINE VISCOUS 2 % MT SOLN: OROMUCOSAL | 3 refills | Status: DC

## 2017-06-27 NOTE — Telephone Encounter (Signed)
Spoke with the spouse. Confirmed the pharmacy. Rx transmitted to [facility].

## 2017-06-27 NOTE — Telephone Encounter (Signed)
Completed by Burr Medico, LPN

## 2017-06-27 NOTE — Telephone Encounter (Signed)
Patient likely sensitive to esophageal biopsies, please advise patient to take viscous lidocaine 2% before meals and at bedtime for next 3-5 days. Thanks

## 2017-06-27 NOTE — Telephone Encounter (Signed)
  Follow up Call-  Call back number 06/26/2017  Post procedure Call Back phone  # [phone]  Permission to leave phone message Yes     Patient questions:  Do you have a fever, pain , or abdominal swelling? Yes.   Pain Score  7 *  Have you tolerated food without any problems? No.  Have you been able to return to your normal activities? No.  Do you have any questions about your discharge instructions: Diet   No. Medications  No. Follow up visit  No.  Do you have questions or concerns about your Care? Yes.    Actions: * If pain score is 4 or above: Physician/ provider Notified : Harl Bowie, MD. Follow up call made this morning. Pt is complaining of new onset chest pain since procedure on yesterday. Pt verbalize pain is a 7 on the pain scale. Pt verbalize pain is sharp and constant. Pt verbalize nothing makes it better or worst. Pt verbalize it hurts worst when drinking or eating. Advised I will notify  Dr. Silverio Decamp this morning.

## 2017-07-01 ENCOUNTER — Encounter: Payer: Self-pay | Admitting: Gastroenterology

## 2017-07-03 ENCOUNTER — Other Ambulatory Visit: Payer: Self-pay

## 2017-07-03 ENCOUNTER — Encounter: Payer: Self-pay | Admitting: Gastroenterology

## 2017-07-03 MED ORDER — PANTOPRAZOLE SODIUM 40 MG PO TBEC: 40.0000 mg | DELAYED_RELEASE_TABLET | Freq: Every day | ORAL | 11 refills | Status: DC

## 2017-07-13 ENCOUNTER — Telehealth: Payer: Self-pay | Admitting: Gastroenterology

## 2017-07-13 NOTE — Telephone Encounter (Signed)
°  Message thru my chart: Hi Doctor  As i have an appointment on september 25th but i want to see you early if it is possible, because of having stomach issues.  3 to 4 times bowel movement with cramps,pain in belly with back pain too which is constant and some sort of bloating and gas.    Thanks

## 2017-07-13 NOTE — Telephone Encounter (Signed)
My chart message sent back to the patient. Please see other email from today.

## 2017-08-08 ENCOUNTER — Encounter: Payer: Self-pay | Admitting: Gastroenterology

## 2017-08-08 ENCOUNTER — Ambulatory Visit (INDEPENDENT_AMBULATORY_CARE_PROVIDER_SITE_OTHER): Payer: 59 | Admitting: Gastroenterology

## 2017-08-08 VITALS — BP 110/62 | HR 72 | Ht 63.0 in | Wt 110.0 lb

## 2017-08-08 DIAGNOSIS — K219 Gastro-esophageal reflux disease without esophagitis: Secondary | ICD-10-CM

## 2017-08-08 DIAGNOSIS — K581 Irritable bowel syndrome with constipation: Secondary | ICD-10-CM

## 2017-08-08 DIAGNOSIS — F411 Generalized anxiety disorder: Secondary | ICD-10-CM | POA: Diagnosis not present

## 2017-08-08 DIAGNOSIS — R140 Abdominal distension (gaseous): Secondary | ICD-10-CM | POA: Diagnosis not present

## 2017-08-08 MED ORDER — RANITIDINE HCL 150 MG PO TABS: 150.0000 mg | ORAL_TABLET | Freq: Every day | ORAL | 3 refills | Status: DC

## 2017-08-08 NOTE — Patient Instructions (Addendum)
We have given you the information/Brochure  to contact [facility]  Call them to schedule your appointment at [phone]    We will send in your Zantac to your pharmacy  Take Psyllium with breakfast  Use 1/2 capsul Miralax daily at bedtime   Gluten-Free Diet for Celiac Disease, Adult The gluten-free diet includes all foods that do not contain gluten. Gluten is a protein that is found in wheat, rye, barley, and some other grains. Following the gluten-free diet is the only treatment for people with celiac disease. It helps to prevent damage to the intestines and improves or eliminates the symptoms of celiac disease. Following the gluten-free diet requires some planning. It can be challenging at first, but it gets easier with time and practice. There are more gluten-free options available today than ever before. If you need help finding gluten-free foods or if you have questions, talk with your diet and nutrition specialist (registered dietitian) or your health care provider. What do I need to know about a gluten-free diet?  All fruits, vegetables, and meats are safe to eat and do not contain gluten.  When grocery shopping, start by shopping in the produce, meat, and dairy sections. These sections are more likely to contain gluten-free foods. Then move to the aisles that contain packaged foods if you need to.  Read all food labels. Gluten is often added to foods. Always check the ingredient list and look for warnings, such as "may contain gluten."  Talk with your dietitian or health care provider before taking a gluten-free multivitamin or mineral supplement.  Be aware of gluten-free foods having contact with foods that contain gluten (cross-contamination). This can happen at home and with any processed foods. ? Talk with your health care provider or dietitian about how to reduce the risk of cross-contamination in your home. ? If you have questions about how a food is  processed, ask the manufacturer. What key words help to identify gluten? Foods that list any of these key words on the label usually contain gluten:  Wheat, flour, enriched flour, bromated flour, white flour, durum flour, graham flour, phosphated flour, self-rising flour, semolina, farina, barley (malt), rye, and oats.  Starch, dextrin, modified food starch, or cereal.  Thickening, fillers, or emulsifiers.  Malt flavoring, malt extract, or malt syrup.  Hydrolyzed vegetable protein.  In the [place] packaged foods that are gluten-free are required to be labeled "GF." These foods should be easy to identify and are safe to eat. In the [place] food companies are also required to list common food allergens, including wheat, on their labels. Recommended foods Grains  Amaranth, bean flours, 100% buckwheat flour, corn, millet, nut flours or nut meals, GF oats, quinoa, rice, sorghum, teff, rice wafers, pure cornmeal tortillas, popcorn, and hot cereals made from cornmeal. Hominy, rice, wild rice. Some Asian rice noodles or bean noodles. Arrowroot starch, corn bran, corn flour, corn germ, cornmeal, corn starch, potato flour, potato starch flour, and rice bran. Plain, brown, and sweet rice flours. Rice polish, soy flour, and tapioca starch. Vegetables  All plain fresh, frozen, and canned vegetables. Fruits  All plain fresh, frozen, canned, and dried fruits, and 100% fruit juices. Meats and other protein foods  All fresh beef, pork, poultry, fish, seafood, and eggs. Fish canned in water, oil, brine, or vegetable broth. Plain nuts and seeds, peanut butter. Some lunch meat and some frankfurters. Dried beans, dried peas, and lentils. Dairy  Fresh plain, dry, evaporated, or condensed milk. Cream, butter, sour  cream, whipping cream, and most yogurts. Unprocessed cheese, most processed cheeses, some cottage cheese, some cream cheeses. Beverages  Coffee, tea, most herbal teas. Carbonated beverages and some  root beers. Wine, sake, and distilled spirits, such as gin, vodka, and whiskey. Most hard ciders. Fats and oils  Butter, margarine, vegetable oil, hydrogenated butter, olive oil, shortening, lard, cream, and some mayonnaise. Some commercial salad dressings. Olives. Sweets and desserts  Sugar, honey, some syrups, molasses, jelly, and jam. Plain hard candy, marshmallows, and gumdrops. Pure cocoa powder. Plain chocolate. Custard and some pudding mixes. Gelatin desserts, sorbets, frozen ice pops, and sherbet. Cake, cookies, and other desserts prepared with allowed flours. Some commercial ice creams. Cornstarch, tapioca, and rice puddings. Seasoning and other foods  Some canned or frozen soups. Monosodium glutamate (MSG). Cider, rice, and wine vinegar. Baking soda and baking powder. Cream of tartar. Baking and nutritional yeast. Certain soy sauces made without wheat (ask your dietitian about specific brands that are allowed). Nuts, coconut, and chocolate. Salt, pepper, herbs, spices, flavoring extracts, imitation or artificial flavorings, natural flavorings, and food colorings. Some medicines and supplements. Some lip glosses and other cosmetics. Rice syrups. The items listed may not be a complete list. Talk with your dietitian about what dietary choices are best for you. Foods to avoid Grains  Barley, bran, bulgur, couscous, cracked wheat, [place], farro, graham, malt, matzo, semolina, wheat germ, and all wheat and rye cereals including spelt and kamut. Cereals containing malt as a flavoring, such as rice cereal. Noodles, spaghetti, macaroni, most packaged rice mixes, and all mixes containing wheat, rye, barley, or triticale. Vegetables  Most creamed vegetables and most vegetables canned in sauces. Some commercially prepared vegetables and salads. Fruits  Thickened or prepared fruits and some pie fillings. Some fruit snacks and fruit roll-ups. Meats and other protein foods  Any meat or meat  alternative containing wheat, rye, barley, or gluten stabilizers. These are often marinated or packaged meats and lunch meats. Bread-containing products, such as Swiss steak, croquettes, meatballs, and meatloaf. Most tuna canned in vegetable broth and [place] with hydrolyzed vegetable protein (HVP) injected as part of the basting. [place]. Imitation fish. Eggs in sauces made from ingredients to avoid. Dairy  Commercial chocolate milk drinks and malted milk. Some non-dairy creamers. Any cheese product containing ingredients to avoid. Beverages  Certain cereal beverages. Beer, ale, malted milk, and some root beers. Some hard ciders. Some instant flavored coffees. Some herbal teas made with barley or with barley malt added. Fats and oils  Some commercial salad dressings. Sour cream containing modified food starch. Sweets and desserts  Some toffees. Chocolate-coated nuts (may be rolled in wheat flour) and some commercial candies and candy bars. Most cakes, cookies, donuts, pastries, and other baked goods. Some commercial ice cream. Ice cream cones. Commercially prepared mixes for cakes, cookies, and other desserts. Bread pudding and other puddings thickened with flour. Products containing brown rice syrup made with barley malt enzyme. Desserts and sweets made with malt flavoring. Seasoning and other foods  Some curry powders, some dry seasoning mixes, some gravy extracts, some meat sauces, some ketchups, some prepared mustards, and horseradish. Certain soy sauces. Malt vinegar. Bouillon and bouillon cubes that contain HVP. Some chip dips, and some chewing gum. Yeast extract. Brewer's yeast. Caramel color. Some medicines and supplements. Some lip glosses and other cosmetics. The items listed may not be a complete list. Talk with your dietitian about what dietary choices are best for you. Summary  Gluten is a protein that is  found in wheat, rye, barley, and some other grains. The gluten-free diet includes  all foods that do not contain gluten.  If you need help finding gluten-free foods or if you have questions, talk with your diet and nutrition specialist (registered dietitian) or your health care provider.  Read all food labels. Gluten is often added to foods. Always check the ingredient list and look for warnings, such as "may contain gluten." This information is not intended to replace advice given to you by your health care provider. Make sure you discuss any questions you have with your health care provider. Document Released: 10/31/2005 Document Revised: 08/15/2016 Document Reviewed: 08/15/2016 Elsevier Interactive Patient Education  2018 [employer].   Constipation, Adult Constipation is when a person:  Poops (has a bowel movement) fewer times in a week than normal.  Has a hard time pooping.  Has poop that is dry, hard, or bigger than normal.  Follow these instructions at home: Eating and drinking   Eat foods that have a lot of fiber, such as: ? Fresh fruits and vegetables. ? Whole grains. ? Beans.  Eat less of foods that are high in fat, low in fiber, or overly processed, such as: ? [place] fries. ? Hamburgers. ? Cookies. ? Candy. ? Soda.  Drink enough fluid to keep your pee (urine) clear or pale yellow. General instructions  Exercise regularly or as told by your doctor.  Go to the restroom when you feel like you need to poop. Do not hold it in.  Take over-the-counter and prescription medicines only as told by your doctor. These include any fiber supplements.  Do pelvic floor retraining exercises, such as: ? Doing deep breathing while relaxing your lower belly (abdomen). ? Relaxing your pelvic floor while pooping.  Watch your condition for any changes.  Keep all follow-up visits as told by your doctor. This is important. Contact a doctor if:  You have pain that gets worse.  You have a fever.  You have not pooped for 4 days.  You throw up  (vomit).  You are not hungry.  You lose weight.  You are bleeding from the anus.  You have thin, pencil-like poop (stool). Get help right away if:  You have a fever, and your symptoms suddenly get worse.  You leak poop or have blood in your poop.  Your belly feels hard or bigger than normal (is bloated).  You have very bad belly pain.  You feel dizzy or you faint. This information is not intended to replace advice given to you by your health care provider. Make sure you discuss any questions you have with your health care provider. Document Released: 04/18/2008 Document Revised: 05/20/2016 Document Reviewed: 04/20/2016 Elsevier Interactive Patient Education  2017 [employer].    Gastroesophageal Reflux Disease, Adult Normally, food travels down the esophagus and stays in the stomach to be digested. However, when a person has gastroesophageal reflux disease (GERD), food and stomach acid move back up into the esophagus. When this happens, the esophagus becomes sore and inflamed. Over time, GERD can create small holes (ulcers) in the lining of the esophagus. What are the causes? This condition is caused by a problem with the muscle between the esophagus and the stomach (lower esophageal sphincter, or LES). Normally, the LES muscle closes after food passes through the esophagus to the stomach. When the LES is weakened or abnormal, it does not close properly, and that allows food and stomach acid to go back up into the esophagus.  The LES can be weakened by certain dietary substances, medicines, and medical conditions, including:  Tobacco use.  Pregnancy.  Having a hiatal hernia.  Heavy alcohol use.  Certain foods and beverages, such as coffee, chocolate, onions, and peppermint.  What increases the risk? This condition is more likely to develop in:  People who have an increased body weight.  People who have connective tissue disorders.  People who use NSAID  medicines.  What are the signs or symptoms? Symptoms of this condition include:  Heartburn.  Difficult or painful swallowing.  The feeling of having a lump in the throat.  Abitter taste in the mouth.  Bad breath.  Having a large amount of saliva.  Having an upset or bloated stomach.  Belching.  Chest pain.  Shortness of breath or wheezing.  Ongoing (chronic) cough or a night-time cough.  Wearing away of tooth enamel.  Weight loss.  Different conditions can cause chest pain. Make sure to see your health care provider if you experience chest pain. How is this diagnosed? Your health care provider will take a medical history and perform a physical exam. To determine if you have mild or severe GERD, your health care provider may also monitor how you respond to treatment. You may also have other tests, including:  An endoscopy toexamine your stomach and esophagus with a small camera.  A test thatmeasures the acidity level in your esophagus.  A test thatmeasures how much pressure is on your esophagus.  A barium swallow or modified barium swallow to show the shape, size, and functioning of your esophagus.  How is this treated? The goal of treatment is to help relieve your symptoms and to prevent complications. Treatment for this condition may vary depending on how severe your symptoms are. Your health care provider may recommend:  Changes to your diet.  Medicine.  Surgery.  Follow these instructions at home: Diet  Follow a diet as recommended by your health care provider. This may involve avoiding foods and drinks such as: ? Coffee and tea (with or without caffeine). ? Drinks that containalcohol. ? Energy drinks and sports drinks. ? Carbonated drinks or sodas. ? Chocolate and cocoa. ? Peppermint and mint flavorings. ? Garlic and onions. ? Horseradish. ? Spicy and acidic foods, including peppers, chili powder, curry powder, vinegar, hot sauces, and barbecue  sauce. ? Citrus fruit juices and citrus fruits, such as oranges, lemons, and limes. ? Tomato-based foods, such as red sauce, chili, salsa, and pizza with red sauce. ? Fried and fatty foods, such as donuts, french fries, potato chips, and high-fat dressings. ? High-fat meats, such as hot dogs and fatty cuts of red and white meats, such as rib eye steak, sausage, ham, and bacon. ? High-fat dairy items, such as whole milk, butter, and cream cheese.  Eat small, frequent meals instead of large meals.  Avoid drinking large amounts of liquid with your meals.  Avoid eating meals during the 2-3 hours before bedtime.  Avoid lying down right after you eat.  Do not exercise right after you eat. General instructions  Pay attention to any changes in your symptoms.  Take over-the-counter and prescription medicines only as told by your health care provider. Do not take aspirin, ibuprofen, or other NSAIDs unless your health care provider told you to do so.  Do not use any tobacco products, including cigarettes, chewing tobacco, and e-cigarettes. If you need help quitting, ask your health care provider.  Wear loose-fitting clothing. Do not wear anything tight  around your waist that causes pressure on your abdomen.  Raise (elevate) the head of your bed 6 inches (15cm).  Try to reduce your stress, such as with yoga or meditation. If you need help reducing stress, ask your health care provider.  If you are overweight, reduce your weight to an amount that is healthy for you. Ask your health care provider for guidance about a safe weight loss goal.  Keep all follow-up visits as told by your health care provider. This is important. Contact a health care provider if:  You have new symptoms.  You have unexplained weight loss.  You have difficulty swallowing, or it hurts to swallow.  You have wheezing or a persistent cough.  Your symptoms do not improve with treatment.  You have a hoarse  voice. Get help right away if:  You have pain in your arms, neck, jaw, teeth, or back.  You feel sweaty, dizzy, or light-headed.  You have chest pain or shortness of breath.  You vomit and your vomit looks like blood or coffee grounds.  You faint.  Your stool is bloody or black.  You cannot swallow, drink, or eat. This information is not intended to replace advice given to you by your health care provider. Make sure you discuss any questions you have with your health care provider. Document Released: 08/10/2005 Document Revised: 03/30/2016 Document Reviewed: 02/25/2015 Elsevier Interactive Patient Education  2017 [employer].

## 2017-08-08 NOTE — Progress Notes (Signed)
Jane Torres    [id number]    [birth year]  Primary Care Physician:Crawford, Real Cons, MD  Referring Physician: Hoyt Koch, MD [place],  [id number]  Chief complaint:  Bloating, GERD, Constipation, Rectal bleeding  HPI: 36 yr F here for follow up visit. She complains of persistent abdominal bloating and intermittent heartburn. She took Protonix daily for 2 months with good symptom control and has stopped taking it regularly in the past few weeks. She is noticing intermittent heartburn and is taking Protonix as needed. Patient has intermittent anal fissure and rectal bleeding associated with constipation and excessive straining, she did not use rectal nitroglycerin because she had headache using it in the past. She is trying to avoid gluten. Continues to have abdominal bloating and cramps. Per her husband she has generalized myalgia and severe anxiety. Patient also feels anxiety is exacerbating a lot of her symptoms. She is learning to drive and she feels she is getting severe headache and arm pain due to anxiety associated with it. She is not seeing any behavioral therapist. Extensive GI workup unrevealing for any significant pathology  Previous HPI: 36 year old female here for new patient visit. She previously was seen by Dr. Collene Mares and also by Dr. Carlton Adam at [facility]. She has had extensive GI workup as outlined in history of present illness and also below in data reviewed. She has been having intermittent symptoms for the past 2 years. Generalized abdominal pain associated with fullness and bloating. She sometimes has constipation with hard stool alternating with loose semi-formed stool. She can have anywhere from 0-3 bowel movements daily. She is avoiding lactose and gluten. She feels her symptoms are worse when she tries to eat gluten.She also has intermittent episodes of bright red blood per rectum and also  excessive mucus discharge. Sometimes blood is mixed in stool. 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 Patient also complains of intermittent solid food dysphagia, she feels food gets hung up in her lower chest.  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 She is on PPI twice daily with improvement of heartburn. She has been anemic with iron deficiency and is taking oral iron.  She has lost over 30 pounds in the past 2 years and has been unable to gain weight. 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  She was treated empirically with rifaximin for small intestinal bacterial overgrowth and also probiotic lactobacillus with no improvement. No significant improvement with LevBID.  Outpatient Encounter Prescriptions as of 08/08/2017  Medication Sig  . betamethasone valerate ointment (VALISONE) 0.1 % Apply a pea sized amount topically BID for 1-2 weeks as needed  . Cholecalciferol (VITAMIN D3) 2000 units TABS Take 1 tablet by mouth daily.  Marland Kitchen dicyclomine (BENTYL) 10 MG capsule Take 1 capsule (10 mg total) by mouth 4 (four) times daily -  before meals and at bedtime.  . Ferrous Sulfate (SLOW FE) 142 (45 Fe) MG TBCR Take 1 tablet by mouth at bedtime.  . pantoprazole (PROTONIX) 40 MG tablet Take 1 tablet (40 mg total) by mouth daily.  . ranitidine (ZANTAC) 150 MG tablet Take 1 tablet (150 mg total) by mouth at bedtime.  . [DISCONTINUED] AMBULATORY NON FORMULARY MEDICATION Medication Name: Nitroglycerin ointment 0.125% 2-3 times daily per pea  sized amount per rectum for 1-2 months (Patient not taking: Reported on 06/26/2017)  . [DISCONTINUED] lidocaine (XYLOCAINE) 2 % solution Swallow 20 ml ac meals and hs  . [DISCONTINUED] metroNIDAZOLE (METROGEL) 0.75 % vaginal gel Place 1 Applicatorful vaginally at bedtime. For 5 nights (Patient not taking: Reported on 06/26/2017)    Facility-Administered Encounter Medications as of 08/08/2017  Medication  . 0.9 %  sodium chloride infusion    Allergies as of 08/08/2017 - Review Complete 08/08/2017  Allergen Reaction Noted  . Gluten meal Nausea And Vomiting 11/05/2015    Past Medical History:  Diagnosis Date  . Allergy   . Anemia   . Anxiety   . Celiac disease   . Colon polyps   . GERD (gastroesophageal reflux disease)     Past Surgical History:  Procedure Laterality Date  . COLONOSCOPY    . UPPER GASTROINTESTINAL ENDOSCOPY      Family History  Problem Relation Age of Onset  . Colon cancer Neg Hx   . Esophageal cancer Neg Hx   . Rectal cancer Neg Hx   . Stomach cancer Neg Hx     Social History   Social History  . Marital status: Married    Spouse name: N/A  . Number of children: 1  . Years of education: N/A   Occupational History  . Not on file.   Social History Main Topics  . Smoking status: Never Smoker  . Smokeless tobacco: Never Used  . Alcohol use No  . Drug use: No  . Sexual activity: Yes    Partners: Male    Birth control/ protection: None   Other Topics Concern  . Not on file   Social History Narrative  . No narrative on file      Review of systems: Review of Systems  Constitutional: Negative for fever and chills. Positive for lack of energy HENT: Positive for sinus problems  Eyes: Negative for blurred vision.  Respiratory: Negative for cough, shortness of breath and wheezing.   Cardiovascular: Negative for chest pain and palpitations.  Gastrointestinal: as per HPI Genitourinary: Negative for dysuria, urgency, frequency and hematuria.  Musculoskeletal: Positive for myalgias, back pain and joint pain.  Skin: Negative for itching and rash.  Neurological: Negative for dizziness, tremors, focal weakness, seizures and loss of consciousness. Positive for headaches Endo/Heme/Allergies: Positive for seasonal allergies.  Psychiatric/Behavioral: Negative for depression,  suicidal ideas and hallucinations.  All other systems reviewed and are negative.   Physical Exam: Vitals:   08/08/17 0919  BP: 110/62  Pulse: 72   Body mass index is 19.49 kg/m. Gen:      No acute distress HEENT:  EOMI, sclera anicteric Neck:     No masses; no thyromegaly Lungs:    Clear to auscultation bilaterally; normal respiratory effort CV:         Regular rate and rhythm; no murmurs Abd:      + bowel sounds; soft, non-tender; no palpable masses, no distension Ext:    No edema; adequate peripheral perfusion Skin:      Warm and dry; no rash Neuro: alert and oriented x 3 Psych: normal mood and affect  Data Reviewed:  Reviewed labs, radiology imaging, old records and pertinent past GI work up   Assessment and Plan/Recommendations:  36 year old female with? 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 Greater than 50% of the time used for counseling as well as treatment plan and follow-up. She had multiple questions which were answered to her satisfaction  K. Denzil Magnuson , MD [phone] Mon-Fri 8a-5p [phone] after 5p, weekends, holidays  CC: Hoyt Koch, *

## 2017-08-09 ENCOUNTER — Encounter: Payer: Self-pay | Admitting: Gastroenterology

## 2017-08-11 ENCOUNTER — Other Ambulatory Visit: Payer: Self-pay

## 2017-08-11 MED ORDER — AMBULATORY NON FORMULARY MEDICATION: 1.0000 "application " | Freq: Four times a day (QID) | 0 refills | Status: DC

## 2017-08-16 ENCOUNTER — Encounter: Payer: Self-pay | Admitting: Gastroenterology

## 2017-08-16 ENCOUNTER — Encounter: Payer: Self-pay | Admitting: Obstetrics and Gynecology

## 2017-08-17 ENCOUNTER — Telehealth: Payer: Self-pay | Admitting: *Deleted

## 2017-08-17 ENCOUNTER — Telehealth: Payer: Self-pay | Admitting: Obstetrics and Gynecology

## 2017-08-17 NOTE — Telephone Encounter (Signed)
Patient canceled her appointment for discuss iron supplements, side effects and did not wish to reschedule at this time.

## 2017-08-17 NOTE — Telephone Encounter (Signed)
Spoke with patients spouse, "Ashish", ok per current dpr. States wife would like to discuss stopping iron supplements d/t side effects. Reports no bowel changes or diet changes. Has been experiencing bloating and gas for the past 4-5 days, feels this is r/t iron. Spouse states would like to discuss with Dr. Talbert Nan prior to stopping. Recommended f/u with PCP or GI, patient declined at this time. States would like to discuss with Dr. Talbert Nan.   Scheduled for OV on 08/21/17 at 1:15pm. Advised spouse patient may be referred back to PCP or GI. Will review with Dr. Nelson Chimes and return call with any additional recommendations, spouse is agreeable.  Routing to provider for final review. Patient is agreeable to disposition. Will close encounter.      From [facility] To Salvadore Dom, MD Sent 08/16/2017 2:11 PM  Hi Dr  Can we repeat the iron tests as I have been taking it for 5 months with some sort of side effects.   Thanks

## 2017-08-17 NOTE — Telephone Encounter (Signed)
See telephone encounter dated 08/17/17.

## 2017-08-21 ENCOUNTER — Ambulatory Visit: Payer: Self-pay | Admitting: Obstetrics and Gynecology

## 2017-08-30 ENCOUNTER — Ambulatory Visit: Payer: Self-pay | Admitting: Nurse Practitioner

## 2017-08-30 ENCOUNTER — Encounter: Payer: Self-pay | Admitting: Internal Medicine

## 2017-08-30 DIAGNOSIS — E611 Iron deficiency: Secondary | ICD-10-CM

## 2017-08-31 ENCOUNTER — Other Ambulatory Visit (INDEPENDENT_AMBULATORY_CARE_PROVIDER_SITE_OTHER): Payer: 59

## 2017-08-31 ENCOUNTER — Encounter: Payer: Self-pay | Admitting: Internal Medicine

## 2017-08-31 ENCOUNTER — Ambulatory Visit (INDEPENDENT_AMBULATORY_CARE_PROVIDER_SITE_OTHER): Payer: 59 | Admitting: Internal Medicine

## 2017-08-31 VITALS — BP 112/68 | HR 74 | Temp 98.3°F | Ht 63.0 in | Wt 110.0 lb

## 2017-08-31 DIAGNOSIS — M25512 Pain in left shoulder: Secondary | ICD-10-CM

## 2017-08-31 DIAGNOSIS — E611 Iron deficiency: Secondary | ICD-10-CM | POA: Diagnosis not present

## 2017-08-31 DIAGNOSIS — R6889 Other general symptoms and signs: Secondary | ICD-10-CM

## 2017-08-31 DIAGNOSIS — G4452 New daily persistent headache (NDPH): Secondary | ICD-10-CM

## 2017-08-31 DIAGNOSIS — M25511 Pain in right shoulder: Secondary | ICD-10-CM | POA: Diagnosis not present

## 2017-08-31 DIAGNOSIS — M542 Cervicalgia: Secondary | ICD-10-CM

## 2017-08-31 LAB — COMPREHENSIVE METABOLIC PANEL
ALT: 13 U/L (ref 0–35)
AST: 16 U/L (ref 0–37)
Albumin: 4.5 g/dL (ref 3.5–5.2)
Alkaline Phosphatase: 62 U/L (ref 39–117)
BUN: 8 mg/dL (ref 6–23)
CO2: 31 mEq/L (ref 19–32)
Calcium: 10 mg/dL (ref 8.4–10.5)
Chloride: 103 mEq/L (ref 96–112)
Creatinine, Ser: 0.57 mg/dL (ref 0.40–1.20)
GFR: 128.98 mL/min (ref >60.00)
Glucose, Bld: 93 mg/dL (ref 70–99)
Potassium: 4.7 mEq/L (ref 3.5–5.1)
Sodium: 140 mEq/L (ref 135–145)
Total Bilirubin: 0.5 mg/dL (ref 0.2–1.2)
Total Protein: 7.5 g/dL (ref 6.0–8.3)

## 2017-08-31 LAB — FERRITIN: Ferritin: 18.4 ng/mL (ref 10.0–291.0)

## 2017-08-31 LAB — CBC
HCT: 37 % (ref 36.0–46.0)
Hemoglobin: 12.2 g/dL (ref 12.0–15.0)
MCHC: 33 g/dL (ref 30.0–36.0)
MCV: 94.5 fl (ref 78.0–100.0)
Platelets: 285 10*3/uL (ref 150.0–400.0)
RBC: 3.92 Mil/uL (ref 3.87–5.11)
RDW: 12.8 % (ref 11.5–15.5)
WBC: 7.7 10*3/uL (ref 4.0–10.5)

## 2017-08-31 LAB — CK: Total CK: 44 U/L (ref 7–177)

## 2017-08-31 LAB — TSH: TSH: 3.52 u[IU]/mL (ref 0.35–4.50)

## 2017-08-31 LAB — T4, FREE: Free T4: 0.83 ng/dL (ref 0.60–1.60)

## 2017-08-31 LAB — SEDIMENTATION RATE: Sed Rate: 14 mm/hr (ref 0–20)

## 2017-08-31 NOTE — Progress Notes (Signed)
   Subjective:    Patient ID: Jane Torres, female    DOB: [birth year], 36 y.o.   MRN: [id number]  HPI The patient is a 36 YO female coming in for several concerns including cold sensitivity (in the last several months, with exposure to cold she gets stiffness and uncomfort in her hands, feels cold more often than others), and pain in her neck (started several weeks ago, stiffness in her neck and shoulder muscles, works itself out some with moving, denies fevers or chills, no change in weight, no numbness or weakness in her hands, denies injury or overuse), and headaches (more with the neck stiffness, worse when she is sitting for a long time then moves, has tried tylenol with good relief, is taking daily lately).   Review of Systems  Constitutional: Negative.   HENT: Negative.   Eyes: Negative.   Respiratory: Negative for cough, chest tightness and shortness of breath.   Cardiovascular: Negative for chest pain, palpitations and leg swelling.  Gastrointestinal: Negative for abdominal distention, abdominal pain, constipation, diarrhea, nausea and vomiting.  Endocrine: Positive for cold intolerance. Negative for heat intolerance, polydipsia, polyphagia and polyuria.  Musculoskeletal: Positive for arthralgias, myalgias, neck pain and neck stiffness.  Skin: Negative.   Neurological: Positive for headaches.  Psychiatric/Behavioral: Negative.       Objective:   Physical Exam  Constitutional: She is oriented to person, place, and time. She appears well-developed and well-nourished.  HENT:  Head: Normocephalic and atraumatic.  Eyes: EOM are normal.  Neck: Normal range of motion.  Cardiovascular: Normal rate and regular rhythm.   Pulmonary/Chest: Effort normal and breath sounds normal. No respiratory distress. She has no wheezes. She has no rales.  Abdominal: Soft. Bowel sounds are normal. She exhibits no distension. There is no tenderness. There is no rebound.  Musculoskeletal: She  exhibits tenderness. She exhibits no edema.  Tenderness in the neck and shoulder muscles as well as the scapular region bilaterally.   Neurological: She is alert and oriented to person, place, and time. Coordination normal.  Skin: Skin is warm and dry.  Psychiatric: She has a normal mood and affect.   Vitals:   08/31/17 1601  BP: 112/68  Pulse: 74  Temp: 98.3 F (36.8 C)  TempSrc: Oral  SpO2: 100%  Weight: 110 lb (49.9 kg)  Height: 5' 3"  (1.6 m)      Assessment & Plan:

## 2017-08-31 NOTE — Patient Instructions (Signed)
We are checking the labs today including for the thyroid and the iron.   We are getting you to physical therapy to work on the stiffness.

## 2017-09-01 DIAGNOSIS — R6889 Other general symptoms and signs: Secondary | ICD-10-CM | POA: Insufficient documentation

## 2017-09-01 DIAGNOSIS — R519 Headache, unspecified: Secondary | ICD-10-CM | POA: Insufficient documentation

## 2017-09-01 DIAGNOSIS — R51 Headache: Secondary | ICD-10-CM

## 2017-09-01 DIAGNOSIS — M542 Cervicalgia: Secondary | ICD-10-CM | POA: Insufficient documentation

## 2017-09-01 LAB — ANA: Anti Nuclear Antibody(ANA): NEGATIVE

## 2017-09-01 NOTE — Assessment & Plan Note (Signed)
Checking B12, vitamin D, thyroid, CBC to rule out metabolic cause. She has decreased in weight significantly over the last several years which could be related.

## 2017-09-01 NOTE — Assessment & Plan Note (Signed)
Likely analgesic rebound from taking tylenol daily lately. She is advised to avoid when able. This could also be related to some tension in her neck muscles.

## 2017-09-01 NOTE — Assessment & Plan Note (Signed)
Likley muscular in nature. Referral to PT to help with tension and ROM. Conservative treatment for pain with otc tylenol and warm compress and otc pain creams

## 2017-09-19 ENCOUNTER — Telehealth: Payer: Self-pay | Admitting: Physical Therapy

## 2017-09-19 NOTE — Telephone Encounter (Signed)
09/12/17 left message to call to schedule PT eval. 09/19/17 spoke with family member, states she does not want to schedule PT

## 2017-09-21 ENCOUNTER — Ambulatory Visit: Payer: 59 | Admitting: Psychology

## 2017-10-10 ENCOUNTER — Ambulatory Visit: Payer: 59 | Admitting: Gastroenterology

## 2017-10-13 ENCOUNTER — Ambulatory Visit: Payer: 59 | Admitting: Psychology

## 2017-10-18 ENCOUNTER — Encounter: Payer: Self-pay | Admitting: Gastroenterology

## 2017-10-18 ENCOUNTER — Ambulatory Visit (INDEPENDENT_AMBULATORY_CARE_PROVIDER_SITE_OTHER): Payer: 59 | Admitting: Gastroenterology

## 2017-10-18 ENCOUNTER — Other Ambulatory Visit (INDEPENDENT_AMBULATORY_CARE_PROVIDER_SITE_OTHER): Payer: 59

## 2017-10-18 VITALS — BP 110/68 | HR 84 | Ht 63.0 in | Wt 113.0 lb

## 2017-10-18 DIAGNOSIS — D508 Other iron deficiency anemias: Secondary | ICD-10-CM

## 2017-10-18 DIAGNOSIS — K588 Other irritable bowel syndrome: Secondary | ICD-10-CM | POA: Diagnosis not present

## 2017-10-18 DIAGNOSIS — K219 Gastro-esophageal reflux disease without esophagitis: Secondary | ICD-10-CM

## 2017-10-18 DIAGNOSIS — R140 Abdominal distension (gaseous): Secondary | ICD-10-CM | POA: Diagnosis not present

## 2017-10-18 DIAGNOSIS — K900 Celiac disease: Secondary | ICD-10-CM

## 2017-10-18 LAB — HEMOGLOBIN: Hemoglobin: 12.4 g/dL (ref 12.0–15.0)

## 2017-10-18 LAB — FERRITIN: Ferritin: 16.6 ng/mL (ref 10.0–291.0)

## 2017-10-18 NOTE — Patient Instructions (Addendum)
Go to the basement for labs today  Take over the counter gaviscon after meals  We will send Levsin to your pharmacy  Try [employer] multivitamins with iron or ferrous gluconate 325 mg daily   If you are age 34 or older, your body mass index should be between 23-30. Your Body mass index is 20.02 kg/m. If this is out of the aforementioned range listed, please consider follow up with your Primary Care Provider.  If you are age 11 or younger, your body mass index should be between 19-25. Your Body mass index is 20.02 kg/m. If this is out of the aformentioned range listed, please consider follow up with your Primary Care Provider.

## 2017-10-20 NOTE — Progress Notes (Signed)
Jane Torres    [id number]    1981/11/02  Primary Care Physician:Crawford, Real Cons, MD  Referring Physician: Hoyt Koch, MD [place], [place] [id number]  Chief complaint: Irritable bowel syndrome, bloating  HPI: 36 year old female here for follow-up visit.  Anal fissure has improved.  No longer constipated, is taking MiraLAX as needed with regular bowel movements.  She is continuing lactose-free and gluten-free diet.  Currently taking Protonix as needed once or twice a week with good control of reflux symptoms.  She continues to have excessive bloating and abdominal cramps, somewhat improved with simethicone daily as needed.    Outpatient Encounter Medications as of 10/18/2017  Medication Sig  . AMBULATORY NON FORMULARY MEDICATION Place 1 application rectally 4 (four) times daily. Medication Name: Nitroglycerine ointment 0.125 %  Apply a pea sized amount internally four times daily. Dispense 30 GM zero refill  . betamethasone valerate ointment (VALISONE) 0.1 % Apply a pea sized amount topically BID for 1-2 weeks as needed  . dicyclomine (BENTYL) 10 MG capsule Take 1 capsule (10 mg total) by mouth 4 (four) times daily -  before meals and at bedtime.  . pantoprazole (PROTONIX) 40 MG tablet Take 1 tablet (40 mg total) by mouth daily.  . ranitidine (ZANTAC) 150 MG tablet Take 1 tablet (150 mg total) by mouth at bedtime.  . [DISCONTINUED] Cholecalciferol (VITAMIN D3) 2000 units TABS Take 1 tablet by mouth daily.  . [DISCONTINUED] Ferrous Sulfate (SLOW FE) 142 (45 Fe) MG TBCR Take 1 tablet by mouth at bedtime.   No facility-administered encounter medications on file as of 10/18/2017.     Allergies as of 10/18/2017 - Review Complete 10/18/2017  Allergen Reaction Noted  . Gluten meal Nausea And Vomiting 11/05/2015    Past Medical History:  Diagnosis Date  . Allergy   . Anemia   . Anxiety   . Celiac disease   . Colon polyps   . GERD  (gastroesophageal reflux disease)     Past Surgical History:  Procedure Laterality Date  . COLONOSCOPY    . UPPER GASTROINTESTINAL ENDOSCOPY      Family History  Problem Relation Age of Onset  . Colon cancer Neg Hx   . Esophageal cancer Neg Hx   . Rectal cancer Neg Hx   . Stomach cancer Neg Hx     Social History   Socioeconomic History  . Marital status: Married    Spouse name: Not on file  . Number of children: 1  . Years of education: Not on file  . Highest education level: Not on file  Social Needs  . Financial resource strain: Not on file  . Food insecurity - worry: Not on file  . Food insecurity - inability: Not on file  . Transportation needs - medical: Not on file  . Transportation needs - non-medical: Not on file  Occupational History  . Not on file  Tobacco Use  . Smoking status: Never Smoker  . Smokeless tobacco: Never Used  Substance and Sexual Activity  . Alcohol use: No    Alcohol/week: 0.0 oz  . Drug use: No  . Sexual activity: Yes    Partners: Male    Birth control/protection: None  Other Topics Concern  . Not on file  Social History Narrative  . Not on file      Review of systems: Review of Systems  Constitutional: Negative for fever and chills.  HENT:  Negative.   Eyes: Negative for blurred vision.  Respiratory: Negative for cough, shortness of breath and wheezing.   Cardiovascular: Negative for chest pain and palpitations.  Gastrointestinal: as per HPI Genitourinary: Negative for dysuria, urgency, frequency and hematuria.  Musculoskeletal: Negative for myalgias, back pain and joint pain.  Skin: Negative for itching and rash.  Neurological: Negative for dizziness, tremors, focal weakness, seizures and loss of consciousness.  Endo/Heme/Allergies: Positive for seasonal allergies.  Psychiatric/Behavioral: Negative for depression, suicidal ideas and hallucinations.  All other systems reviewed and are negative.   Physical Exam: Vitals:     10/18/17 1020  BP: 110/68  Pulse: 84   Body mass index is 20.02 kg/m. Gen:      No acute distress HEENT:  EOMI, sclera anicteric Neck:     No masses; no thyromegaly Lungs:    Clear to auscultation bilaterally; normal respiratory effort CV:         Regular rate and rhythm; no murmurs Abd:      + bowel sounds; soft, non-tender; no palpable masses, no distension Ext:    No edema; adequate peripheral perfusion Skin:      Warm and dry; no rash Neuro: alert and oriented x 3 Psych: normal mood and affect  Data Reviewed:  Reviewed labs, radiology imaging, old records and pertinent past GI work up   Assessment and Plan/Recommendations:  36 year old female with irritable bowel syndrome predominant constipation, GERD, celiac, excessive bloating Continue gluten-free diet Patient had extensive GI workup, 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 Greater than 50% of the time used for counseling as well as treatment plan and follow-up. She had multiple questions which were answered to her satisfaction  K. Denzil Magnuson , MD [phone] Mon-Fri 8a-5p [phone] after 5p, weekends, holidays  CC: Hoyt Koch, *

## 2017-11-15 ENCOUNTER — Encounter: Payer: Self-pay | Admitting: Gastroenterology

## 2017-12-20 NOTE — Progress Notes (Deleted)
   Office Visit Note  Patient: Jane Torres             Date of Birth: 05-01-1981           MRN: [id number]             PCP: Hoyt Koch, MD Referring: Hoyt Koch, * Visit Date: 01/02/2018 Occupation: @GUAROCC @    Subjective:  No chief complaint on file.   History of Present Illness: Jane Torres is a 37 y.o. female ***   Activities of Daily Living:  Patient reports morning stiffness for *** {minute/hour:19697}.   Patient {ACTIONS;DENIES/REPORTS:21021675::"Denies"} nocturnal pain.  Difficulty dressing/grooming: {ACTIONS;DENIES/REPORTS:21021675::"Denies"} Difficulty climbing stairs: {ACTIONS;DENIES/REPORTS:21021675::"Denies"} Difficulty getting out of chair: {ACTIONS;DENIES/REPORTS:21021675::"Denies"} Difficulty using hands for taps, buttons, cutlery, and/or writing: {ACTIONS;DENIES/REPORTS:21021675::"Denies"}   No Rheumatology ROS completed.   PMFS History:  Patient Active Problem List   Diagnosis Date Noted  . Cold intolerance 09/01/2017  . Headache 09/01/2017  . Neck pain 09/01/2017  . GERD (gastroesophageal reflux disease) 09/11/2016  . Insomnia 09/11/2016  . Anxiety state 09/11/2016  . Loss of weight 04/21/2016  . Mild malnutrition ([place]) 04/21/2016  . Excessive flatus 01/11/2016  . Irritable bowel syndrome with diarrhea 01/11/2016  . Abdominal pain, lower 01/11/2016    Past Medical History:  Diagnosis Date  . Allergy   . Anemia   . Anxiety   . Celiac disease   . Colon polyps   . GERD (gastroesophageal reflux disease)     Family History  Problem Relation Age of Onset  . Colon cancer Neg Hx   . Esophageal cancer Neg Hx   . Rectal cancer Neg Hx   . Stomach cancer Neg Hx    Past Surgical History:  Procedure Laterality Date  . COLONOSCOPY    . UPPER GASTROINTESTINAL ENDOSCOPY     Social History   Social History Narrative  . Not on file     Objective: Vital Signs: There were no vitals taken for this visit.    Physical Exam   Musculoskeletal Exam: ***  CDAI Exam: No CDAI exam completed.    Investigation: No additional findings.   Imaging: No results found.  Speciality Comments: No specialty comments available.    Procedures:  No procedures performed Allergies: Gluten meal   Assessment / Plan:     Visit Diagnoses: No diagnosis found.    Orders: No orders of the defined types were placed in this encounter.  No orders of the defined types were placed in this encounter.   Face-to-face time spent with patient was *** minutes. 50% of time was spent in counseling and coordination of care.  Follow-Up Instructions: No Follow-up on file.   Earnestine Mealing, CMA  Note - This record has been created using Editor, commissioning.  Chart creation errors have been sought, but may not always  have been located. Such creation errors do not reflect on  the standard of medical care.

## 2018-01-02 ENCOUNTER — Encounter: Payer: Self-pay | Admitting: Internal Medicine

## 2018-01-02 ENCOUNTER — Other Ambulatory Visit (INDEPENDENT_AMBULATORY_CARE_PROVIDER_SITE_OTHER): Payer: 59

## 2018-01-02 ENCOUNTER — Ambulatory Visit: Payer: 59 | Admitting: Rheumatology

## 2018-01-02 ENCOUNTER — Ambulatory Visit (INDEPENDENT_AMBULATORY_CARE_PROVIDER_SITE_OTHER): Payer: 59 | Admitting: Internal Medicine

## 2018-01-02 VITALS — BP 112/72 | HR 89 | Temp 98.1°F | Ht 63.0 in | Wt 113.0 lb

## 2018-01-02 DIAGNOSIS — N926 Irregular menstruation, unspecified: Secondary | ICD-10-CM

## 2018-01-02 DIAGNOSIS — G4709 Other insomnia: Secondary | ICD-10-CM

## 2018-01-02 DIAGNOSIS — B9789 Other viral agents as the cause of diseases classified elsewhere: Secondary | ICD-10-CM | POA: Diagnosis not present

## 2018-01-02 DIAGNOSIS — G44229 Chronic tension-type headache, not intractable: Secondary | ICD-10-CM

## 2018-01-02 DIAGNOSIS — R5383 Other fatigue: Secondary | ICD-10-CM

## 2018-01-02 DIAGNOSIS — J069 Acute upper respiratory infection, unspecified: Secondary | ICD-10-CM | POA: Diagnosis not present

## 2018-01-02 LAB — TSH: TSH: 3.61 u[IU]/mL (ref 0.35–4.50)

## 2018-01-02 LAB — CBC
HCT: 37.7 % (ref 36.0–46.0)
Hemoglobin: 12.8 g/dL (ref 12.0–15.0)
MCHC: 34 g/dL (ref 30.0–36.0)
MCV: 92.2 fl (ref 78.0–100.0)
Platelets: 373 10*3/uL (ref 150.0–400.0)
RBC: 4.09 Mil/uL (ref 3.87–5.11)
RDW: 12.8 % (ref 11.5–15.5)
WBC: 10.9 10*3/uL — ABNORMAL HIGH (ref 4.0–10.5)

## 2018-01-02 LAB — COMPREHENSIVE METABOLIC PANEL
ALT: 14 U/L (ref 0–35)
AST: 15 U/L (ref 0–37)
Albumin: 4.3 g/dL (ref 3.5–5.2)
Alkaline Phosphatase: 73 U/L (ref 39–117)
BUN: 7 mg/dL (ref 6–23)
CO2: 30 mEq/L (ref 19–32)
Calcium: 9.9 mg/dL (ref 8.4–10.5)
Chloride: 103 mEq/L (ref 96–112)
Creatinine, Ser: 0.61 mg/dL (ref 0.40–1.20)
GFR: 119.03 mL/min (ref >60.00)
Glucose, Bld: 104 mg/dL — ABNORMAL HIGH (ref 70–99)
Potassium: 4.5 mEq/L (ref 3.5–5.1)
Sodium: 139 mEq/L (ref 135–145)
Total Bilirubin: 0.6 mg/dL (ref 0.2–1.2)
Total Protein: 7.6 g/dL (ref 6.0–8.3)

## 2018-01-02 LAB — VITAMIN B12: Vitamin B-12: 219 pg/mL (ref 211–911)

## 2018-01-02 LAB — VITAMIN D 25 HYDROXY (VIT D DEFICIENCY, FRACTURES): VITD: 15.17 ng/mL — ABNORMAL LOW (ref 30.00–100.00)

## 2018-01-02 LAB — FERRITIN: Ferritin: 16.2 ng/mL (ref 10.0–291.0)

## 2018-01-02 MED ORDER — DICYCLOMINE HCL 10 MG PO CAPS: 10.0000 mg | ORAL_CAPSULE | Freq: Three times a day (TID) | ORAL | 0 refills | Status: DC

## 2018-01-02 MED ORDER — PREDNISONE 20 MG PO TABS
40.0000 mg | ORAL_TABLET | Freq: Every day | ORAL | 0 refills | Status: DC
Start: 2018-01-02 — End: 2018-05-29

## 2018-01-02 NOTE — Progress Notes (Signed)
   Subjective:    Patient ID: Jane Torres, female    DOB: [birth year], 37 y.o.   MRN: [id number]  HPI The patient is a 37 YO female coming in for several concerns including irregular periods (having periods some shorter intervals than normal, some spotting with intercourse 3-4 days prior to period, considering having another child and wants to know if she is safe to do that, denies bleeding more than 5 days, heavy first 2 days, not exercising, ), and insomnia (having some sleeping problems, some days will sleep normally, some days will not sleep at all, tried meditation in the last week or two and has not noticed a difference, took a benadryl and felt groggy in the morning) and cold symptoms (going on for about 2 weeks now, denies SOB, mild cough, some bloody discharge, tried otc cold medications without relief, using humidifier, around sick contacts prior to symptoms).   Review of Systems  Constitutional: Positive for activity change and appetite change. Negative for chills, fatigue, fever and unexpected weight change.  HENT: Positive for congestion, postnasal drip, rhinorrhea and sinus pressure. Negative for ear discharge, ear pain, sinus pain, sneezing, sore throat, tinnitus, trouble swallowing and voice change.   Eyes: Negative.   Respiratory: Positive for cough. Negative for chest tightness, shortness of breath and wheezing.   Cardiovascular: Negative.   Gastrointestinal: Positive for abdominal pain and diarrhea. Negative for abdominal distention, anal bleeding, rectal pain and vomiting.       Chronic and stable  Musculoskeletal: Positive for myalgias. Negative for arthralgias and back pain.  Neurological: Positive for headaches. Negative for dizziness, tremors, seizures, facial asymmetry, weakness and numbness.  Psychiatric/Behavioral: Positive for sleep disturbance. Negative for confusion, decreased concentration, dysphoric mood, self-injury and suicidal ideas. The patient is not  nervous/anxious.       Objective:   Physical Exam  Constitutional: She is oriented to person, place, and time. She appears well-developed and well-nourished.  HENT:  Head: Normocephalic and atraumatic.  Oropharynx with redness and sinus pressure frontal, nasal turbinates normal  Eyes: EOM are normal.  Neck: Normal range of motion.  Cardiovascular: Normal rate and regular rhythm.  Pulmonary/Chest: Effort normal and breath sounds normal. No respiratory distress. She has no wheezes. She has no rales.  Abdominal: Soft. Bowel sounds are normal. She exhibits no distension. There is no tenderness. There is no rebound.  Musculoskeletal: She exhibits no edema.  Neurological: She is alert and oriented to person, place, and time. Coordination normal.  Skin: Skin is warm and dry.  Psychiatric:  Wandering historian    Vitals:   01/02/18 0911  BP: 112/72  Pulse: 89  Temp: 98.1 F (36.7 C)  TempSrc: Oral  SpO2: 99%  Weight: 113 lb 0.2 oz (51.3 kg)  Height: 5' 3"  (1.6 m)      Assessment & Plan:

## 2018-01-02 NOTE — Patient Instructions (Signed)
We will check the labs today, it is okay to take 1/2 benadryl at night time.   We will send in the prednisone to take 2 pills daily for 5 days.

## 2018-01-02 NOTE — Assessment & Plan Note (Signed)
Could be some ovulation problems. Asked her to talk to her gyn. She is intending to get pregnant again in the near future and advised her to contact her gynecologist if there are problems with this. Blood counts normal. Checking vitamin D and CBC today. Advised prenatal vitamin daily 1 month prior to conception to ensure adequate stores of folic acid.

## 2018-01-02 NOTE — Assessment & Plan Note (Signed)
She is advised to use 1/2 benadryl at night time or melatonin for sleep. Encouraged good sleep hygiene. Suspect stress or anxiety is causing interrupted sleep.

## 2018-01-02 NOTE — Assessment & Plan Note (Signed)
Lungs clear on exam so no indication for antibiotics. Prednisone rx given today for drainage. Advised to take zyrtec daily.

## 2018-01-02 NOTE — Assessment & Plan Note (Signed)
Some improvement from prior and no longer daily.

## 2018-01-05 ENCOUNTER — Encounter: Payer: Self-pay | Admitting: Internal Medicine

## 2018-01-05 ENCOUNTER — Other Ambulatory Visit: Payer: Self-pay | Admitting: Internal Medicine

## 2018-01-05 MED ORDER — VITAMIN D (ERGOCALCIFEROL) 1.25 MG (50000 UNIT) PO CAPS: 50000.0000 [IU] | ORAL_CAPSULE | ORAL | 0 refills | Status: DC

## 2018-01-25 ENCOUNTER — Encounter: Payer: Self-pay | Admitting: Internal Medicine

## 2018-01-25 ENCOUNTER — Encounter: Payer: Self-pay | Admitting: Gastroenterology

## 2018-02-06 ENCOUNTER — Encounter: Payer: Self-pay | Admitting: Internal Medicine

## 2018-02-07 MED ORDER — HYOSCYAMINE SULFATE 0.125 MG SL SUBL: 0.1250 mg | SUBLINGUAL_TABLET | Freq: Three times a day (TID) | SUBLINGUAL | 0 refills | Status: DC | PRN

## 2018-05-02 ENCOUNTER — Ambulatory Visit: Payer: Self-pay | Admitting: Physician Assistant

## 2018-05-15 ENCOUNTER — Telehealth: Payer: Self-pay | Admitting: Obstetrics and Gynecology

## 2018-05-15 NOTE — Telephone Encounter (Signed)
Left message to call [name] at [phone].  

## 2018-05-15 NOTE — Telephone Encounter (Signed)
Patient is having lower back and abdominal pain.

## 2018-05-15 NOTE — Telephone Encounter (Signed)
Patient returning Jill's call.  °

## 2018-05-15 NOTE — Telephone Encounter (Signed)
Call to patient. Complains of intermittent back and abdominal pain for 4 days.  Requests office visit to assess pain and do annual at same time. Advised need to assess pain and defer annual for now.  LMP 05-07-18. Cycles come early at up to 20 days apart. Condoms for contraception.  Pain is midline to left side in back and abdomen. Pain scale 6-7/10. Tried motrin and anti-spasmotic without improvement. Denies bleeding now. Declined office visit this afternoon.  Instructed to do UPT. Call if positive.  Office visit scheduled for tomorrow at 1pm with Dr Talbert Nan. Instructed to go to ED if increased pain or bleeding. Dr Ernie Hew will review call and call back if additional instructions.

## 2018-05-16 ENCOUNTER — Telehealth: Payer: Self-pay | Admitting: *Deleted

## 2018-05-16 ENCOUNTER — Ambulatory Visit: Payer: 59 | Admitting: Obstetrics and Gynecology

## 2018-05-16 NOTE — Telephone Encounter (Signed)
Call to patient due to cancelled appointment for pelvic pain.  States child is sick and she cannot come. States pain is a little better today. Reports she did UPT and it was negative. Encouraged to call back or go to urgent care/ED if pain returns or worsens.  Patient agreeable to annual exam on Monday 05-21-18.  Routing to provider for final review. Patient agreeable to disposition. Will close encounter.

## 2018-05-18 NOTE — Progress Notes (Deleted)
37 y.o. G49P1011 Married [place] F here for annual exam.      No LMP recorded.          Sexually active: {yes no:314532}  The current method of family planning is {contraception:315051}.    Exercising: {yes no:314532}  {types:19826} Smoker:  {YES NO:22349}  Health Maintenance: Pap:  12-24-15 WNL NEG HR HPV History of abnormal Pap:  no MMG:  Never Colonoscopy:  11-18-15 polyps  BMD:   Never TDaP:  2011 Gardasil: N/A    reports that she has never smoked. She has never used smokeless tobacco. She reports that she does not drink alcohol or use drugs.  Past Medical History:  Diagnosis Date  . Allergy   . Anemia   . Anxiety   . Celiac disease   . Colon polyps   . GERD (gastroesophageal reflux disease)     Past Surgical History:  Procedure Laterality Date  . COLONOSCOPY    . UPPER GASTROINTESTINAL ENDOSCOPY      Current Outpatient Medications  Medication Sig Dispense Refill  . dicyclomine (BENTYL) 10 MG capsule Take 1 capsule (10 mg total) by mouth 4 (four) times daily -  before meals and at bedtime. 60 capsule 0  . hyoscyamine (LEVSIN SL) 0.125 MG SL tablet Place 1 tablet (0.125 mg total) under the tongue 3 (three) times daily as needed. 30 tablet 0  . predniSONE (DELTASONE) 20 MG tablet Take 2 tablets (40 mg total) by mouth daily with breakfast. 10 tablet 0  . Vitamin D, Ergocalciferol, (DRISDOL) 50000 units CAPS capsule Take 1 capsule (50,000 Units total) by mouth every 7 (seven) days. 12 capsule 0   No current facility-administered medications for this visit.     Family History  Problem Relation Age of Onset  . Colon cancer Neg Hx   . Esophageal cancer Neg Hx   . Rectal cancer Neg Hx   . Stomach cancer Neg Hx     Review of Systems  Exam:   There were no vitals taken for this visit.  Weight change: @WEIGHTCHANGE @ Height:      Ht Readings from Last 3 Encounters:  01/02/18 5' 3"  (1.6 m)  10/18/17 5' 3"  (1.6 m)  08/31/17 5' 3"  (1.6 m)    General appearance: alert,  cooperative and appears stated age Head: Normocephalic, without obvious abnormality, atraumatic Neck: no adenopathy, supple, symmetrical, trachea midline and thyroid {CHL AMB PHY EX THYROID NORM DEFAULT:[id number]::"normal to inspection and palpation"} Lungs: clear to auscultation bilaterally Cardiovascular: regular rate and rhythm Breasts: {Exam; breast:13139::"normal appearance, no masses or tenderness"} Abdomen: soft, non-tender; non distended,  no masses,  no organomegaly Extremities: extremities normal, atraumatic, no cyanosis or edema Skin: Skin color, texture, turgor normal. No rashes or lesions Lymph nodes: Cervical, supraclavicular, and axillary nodes normal. No abnormal inguinal nodes palpated Neurologic: Grossly normal   Pelvic: External genitalia:  no lesions              Urethra:  normal appearing urethra with no masses, tenderness or lesions              Bartholins and Skenes: normal                 Vagina: normal appearing vagina with normal color and discharge, no lesions              Cervix: {CHL AMB PHY EX CERVIX NORM DEFAULT:[phone]::"no lesions"}               Bimanual Exam:  Uterus:  {  CHL AMB PHY EX UTERUS NORM DEFAULT:[phone]::"normal size, contour, position, consistency, mobility, non-tender"}              Adnexa: {CHL AMB PHY EX ADNEXA NO MASS DEFAULT:[phone]::"no mass, fullness, tenderness"}               Rectovaginal: Confirms               Anus:  normal sphincter tone, no lesions  Chaperone was present for exam.  A:  Well Woman with normal exam  P:

## 2018-05-21 ENCOUNTER — Ambulatory Visit: Payer: 59 | Admitting: Obstetrics and Gynecology

## 2018-05-28 NOTE — Progress Notes (Signed)
37 y.o. G59P1011 Married [place] F here for annual exam.   Cycles are q 21-26 days.  Period Duration (Days): 4-5 days Period Pattern: (!) Irregular Menstrual Flow: Moderate, Light Menstrual Control: Maxi pad, Thin pad Menstrual Control Change Freq (Hours): changes pad every 4 hours Dysmenorrhea: (!) Mild Dysmenorrhea Symptoms: Cramping  She noticed a slightly swollen and tender gland in her left groin for the last 4-5 days. Slight vulvar itching intermittently, not currently Sexually active, using condoms, no pain with intercourse.   She has IBS, doing okay right now.   Patient's last menstrual period was 05/07/2018 (exact date).          Sexually active: Yes.    The current method of family planning is none.    Exercising: No.  The patient does not participate in regular exercise at present. Smoker:  no  Health Maintenance: Pap:  12-24-15 WNL NEG HR HPV History of abnormal Pap:  no MMG:  Never Colonoscopy:  11-18-15 polyps, needs f/u in 5 years BMD:   Never TDaP:  2011 Gardasil: N/A, counseled     reports that she has never smoked. She has never used smokeless tobacco. She reports that she does not drink alcohol or use drugs. Son is 7  Past Medical History:  Diagnosis Date  . Allergy   . Anemia   . Anxiety   . Celiac disease   . Colon polyps   . GERD (gastroesophageal reflux disease)     Past Surgical History:  Procedure Laterality Date  . COLONOSCOPY    . UPPER GASTROINTESTINAL ENDOSCOPY      Current Outpatient Medications  Medication Sig Dispense Refill  . dicyclomine (BENTYL) 10 MG capsule Take 1 capsule (10 mg total) by mouth 4 (four) times daily -  before meals and at bedtime. 60 capsule 0  . hyoscyamine (LEVSIN SL) 0.125 MG SL tablet Place 1 tablet (0.125 mg total) under the tongue 3 (three) times daily as needed. 30 tablet 0  . vitamin B-12 (CYANOCOBALAMIN) 100 MCG tablet Take 100 mcg by mouth daily.     No current facility-administered medications for this  visit.     Family History  Problem Relation Age of Onset  . Colon cancer Neg Hx   . Esophageal cancer Neg Hx   . Rectal cancer Neg Hx   . Stomach cancer Neg Hx     Review of Systems  Constitutional: Negative.   HENT: Negative.   Eyes: Negative.   Respiratory: Negative.   Cardiovascular: Negative.   Gastrointestinal: Negative.   Endocrine: Negative.   Genitourinary: Positive for menstrual problem.       Lump in groin area  Musculoskeletal: Negative.   Skin: Negative.   Allergic/Immunologic: Negative.   Neurological: Negative.   Hematological: Negative.   Psychiatric/Behavioral: Negative.     Exam:   BP 116/70 (BP Location: Right Arm, Patient Position: Sitting)   Pulse 76   Ht 5' 3"  (1.6 m)   Wt 112 lb 6.4 oz (51 kg)   LMP 05/07/2018 (Exact Date)   BMI 19.91 kg/m   Weight change: @WEIGHTCHANGE @ Height:   Height: 5' 3"  (160 cm)  Ht Readings from Last 3 Encounters:  05/29/18 5' 3"  (1.6 m)  01/02/18 5' 3"  (1.6 m)  10/18/17 5' 3"  (1.6 m)    General appearance: alert, cooperative and appears stated age Head: Normocephalic, without obvious abnormality, atraumatic Neck: no adenopathy, supple, symmetrical, trachea midline and thyroid normal to inspection and palpation Lungs: clear to auscultation bilaterally Cardiovascular:  regular rate and rhythm Breasts: normal appearance, no masses or tenderness Abdomen: soft, non-tender; non distended,  no masses,  no organomegaly Extremities: extremities normal, atraumatic, no cyanosis or edema Skin: Skin color, texture, turgor normal. No rashes or lesions Lymph nodes: Cervical, supraclavicular, and axillary nodes normal. No abnormal inguinal nodes palpated Neurologic: Grossly normal   Pelvic: External genitalia:  no lesions              Urethra:  normal appearing urethra with no masses, tenderness or lesions              Bartholins and Skenes: normal                 Vagina: normal appearing vagina with a slight increase in  white, watery vaginal d/c              Cervix: no lesions               Bimanual Exam:  Uterus:  normal size, contour, position, consistency, mobility, non-tender              Adnexa: no mass, fullness, tenderness               Rectovaginal: Confirms               Anus:  normal sphincter tone, no lesions  Area of patient's concern was on the left vulva, not seen or felt. Patient stated going away.   Chaperone was present for exam.  A:  Well Woman with normal exam  Intermittent itching, increase vaginal d/c on exam  P:   Pap next year  Labs with primary  Condoms for contraction  Discussed breast self exam  Discussed calcium and vit D intake

## 2018-05-29 ENCOUNTER — Other Ambulatory Visit: Payer: Self-pay

## 2018-05-29 ENCOUNTER — Ambulatory Visit (INDEPENDENT_AMBULATORY_CARE_PROVIDER_SITE_OTHER): Payer: 59 | Admitting: Obstetrics and Gynecology

## 2018-05-29 ENCOUNTER — Encounter: Payer: Self-pay | Admitting: Obstetrics and Gynecology

## 2018-05-29 VITALS — BP 116/70 | HR 76 | Ht 63.0 in | Wt 112.4 lb

## 2018-05-29 DIAGNOSIS — N760 Acute vaginitis: Secondary | ICD-10-CM | POA: Diagnosis not present

## 2018-05-29 DIAGNOSIS — Z01419 Encounter for gynecological examination (general) (routine) without abnormal findings: Secondary | ICD-10-CM

## 2018-05-29 NOTE — Patient Instructions (Signed)
EXERCISE AND DIET:  We recommended that you start or continue a regular exercise program for good health. Regular exercise means any activity that makes your heart beat faster and makes you sweat.  We recommend exercising at least 30 minutes per day at least 3 days a week, preferably 4 or 5.  We also recommend a diet low in fat and sugar.  Inactivity, poor dietary choices and obesity can cause diabetes, heart attack, stroke, and kidney damage, among others.    ALCOHOL AND SMOKING:  Women should limit their alcohol intake to no more than 7 drinks/beers/glasses of wine (combined, not each!) per week. Moderation of alcohol intake to this level decreases your risk of breast cancer and liver damage. And of course, no recreational drugs are part of a healthy lifestyle.  And absolutely no smoking or even second hand smoke. Most people know smoking can cause heart and lung diseases, but did you know it also contributes to weakening of your bones? Aging of your skin?  Yellowing of your teeth and nails?  CALCIUM AND VITAMIN D:  Adequate intake of calcium and Vitamin D are recommended.  The recommendations for exact amounts of these supplements seem to change often, but generally speaking 600 mg of calcium (either carbonate or citrate) and 800 units of Vitamin D per day seems prudent. Certain women may benefit from higher intake of Vitamin D.  If you are among these women, your doctor will have told you during your visit.    PAP SMEARS:  Pap smears, to check for cervical cancer or precancers,  have traditionally been done yearly, although recent scientific advances have shown that most women can have pap smears less often.  However, every woman still should have a physical exam from her gynecologist every year. It will include a breast check, inspection of the vulva and vagina to check for abnormal growths or skin changes, a visual exam of the cervix, and then an exam to evaluate the size and shape of the uterus and  ovaries.  And after 35 years of age, a rectal exam is indicated to check for rectal cancers. We will also provide age appropriate advice regarding health maintenance, like when you should have certain vaccines, screening for sexually transmitted diseases, bone density testing, colonoscopy, mammograms, etc.   MAMMOGRAMS:  All women over 40 years old should have a yearly mammogram. Many facilities now offer a "3D" mammogram, which may cost around $50 extra out of pocket. If possible,  we recommend you accept the option to have the 3D mammogram performed.  It both reduces the number of women who will be called back for extra views which then turn out to be normal, and it is better than the routine mammogram at detecting truly abnormal areas.    COLONOSCOPY:  Colonoscopy to screen for colon cancer is recommended for all women at age 50.  We know, you hate the idea of the prep.  We agree, BUT, having colon cancer and not knowing it is worse!!  Colon cancer so often starts as a polyp that can be seen and removed at colonscopy, which can quite literally save your life!  And if your first colonoscopy is normal and you have no family history of colon cancer, most women don't have to have it again for 10 years.  Once every ten years, you can do something that may end up saving your life, right?  We will be happy to help you get it scheduled when you are ready.    Be sure to check your insurance coverage so you understand how much it will cost.  It may be covered as a preventative service at no cost, but you should check your particular policy.      Breast Self-Awareness Breast self-awareness means being familiar with how your breasts look and feel. It involves checking your breasts regularly and reporting any changes to your health care provider. Practicing breast self-awareness is important. A change in your breasts can be a sign of a serious medical problem. Being familiar with how your breasts look and feel allows  you to find any problems early, when treatment is more likely to be successful. All women should practice breast self-awareness, including women who have had breast implants. How to do a breast self-exam One way to learn what is normal for your breasts and whether your breasts are changing is to do a breast self-exam. To do a breast self-exam: Look for Changes  1. Remove all the clothing above your waist. 2. Stand in front of a mirror in a room with good lighting. 3. Put your hands on your hips. 4. Push your hands firmly downward. 5. Compare your breasts in the mirror. Look for differences between them (asymmetry), such as: ? Differences in shape. ? Differences in size. ? Puckers, dips, and bumps in one breast and not the other. 6. Look at each breast for changes in your skin, such as: ? Redness. ? Scaly areas. 7. Look for changes in your nipples, such as: ? Discharge. ? Bleeding. ? Dimpling. ? Redness. ? A change in position. Feel for Changes  Carefully feel your breasts for lumps and changes. It is best to do this while lying on your back on the floor and again while sitting or standing in the shower or tub with soapy water on your skin. Feel each breast in the following way:  Place the arm on the side of the breast you are examining above your head.  Feel your breast with the other hand.  Start in the nipple area and make  inch (2 cm) overlapping circles to feel your breast. Use the pads of your three middle fingers to do this. Apply light pressure, then medium pressure, then firm pressure. The light pressure will allow you to feel the tissue closest to the skin. The medium pressure will allow you to feel the tissue that is a little deeper. The firm pressure will allow you to feel the tissue close to the ribs.  Continue the overlapping circles, moving downward over the breast until you feel your ribs below your breast.  Move one finger-width toward the center of the body.  Continue to use the  inch (2 cm) overlapping circles to feel your breast as you move slowly up toward your collarbone.  Continue the up and down exam using all three pressures until you reach your armpit.  Write Down What You Find  Write down what is normal for each breast and any changes that you find. Keep a written record with breast changes or normal findings for each breast. By writing this information down, you do not need to depend only on memory for size, tenderness, or location. Write down where you are in your menstrual cycle, if you are still menstruating. If you are having trouble noticing differences in your breasts, do not get discouraged. With time you will become more familiar with the variations in your breasts and more comfortable with the exam. How often should I examine my breasts? Examine   your breasts every month. If you are breastfeeding, the best time to examine your breasts is after a feeding or after using a breast pump. If you menstruate, the best time to examine your breasts is 5-7 days after your period is over. During your period, your breasts are lumpier, and it may be more difficult to notice changes. When should I see my health care provider? See your health care provider if you notice:  A change in shape or size of your breasts or nipples.  A change in the skin of your breast or nipples, such as a reddened or scaly area.  Unusual discharge from your nipples.  A lump or thick area that was not there before.  Pain in your breasts.  Anything that concerns you.  This information is not intended to replace advice given to you by your health care provider. Make sure you discuss any questions you have with your health care provider. Document Released: 10/31/2005 Document Revised: 04/07/2016 Document Reviewed: 09/20/2015 Elsevier Interactive Patient Education  2018 [employer]vier Inc[employer]  

## 2018-05-30 LAB — VAGINITIS/VAGINOSIS, DNA PROBE
Candida Species: NEGATIVE
Gardnerella vaginalis: NEGATIVE
Trichomonas vaginosis: NEGATIVE

## 2018-06-12 ENCOUNTER — Encounter: Payer: 59 | Admitting: Internal Medicine

## 2018-06-14 ENCOUNTER — Other Ambulatory Visit: Payer: Self-pay | Admitting: Internal Medicine

## 2018-06-14 MED ORDER — DICYCLOMINE HCL 10 MG PO CAPS: 10.0000 mg | ORAL_CAPSULE | Freq: Three times a day (TID) | ORAL | 0 refills | Status: DC

## 2018-06-28 ENCOUNTER — Ambulatory Visit (INDEPENDENT_AMBULATORY_CARE_PROVIDER_SITE_OTHER): Payer: 59 | Admitting: Internal Medicine

## 2018-06-28 ENCOUNTER — Other Ambulatory Visit (INDEPENDENT_AMBULATORY_CARE_PROVIDER_SITE_OTHER): Payer: 59

## 2018-06-28 ENCOUNTER — Encounter: Payer: Self-pay | Admitting: Internal Medicine

## 2018-06-28 VITALS — BP 118/74 | HR 78 | Temp 98.1°F | Ht 63.0 in | Wt 114.0 lb

## 2018-06-28 DIAGNOSIS — J0110 Acute frontal sinusitis, unspecified: Secondary | ICD-10-CM

## 2018-06-28 DIAGNOSIS — R5383 Other fatigue: Secondary | ICD-10-CM | POA: Diagnosis not present

## 2018-06-28 DIAGNOSIS — K219 Gastro-esophageal reflux disease without esophagitis: Secondary | ICD-10-CM

## 2018-06-28 DIAGNOSIS — J329 Chronic sinusitis, unspecified: Secondary | ICD-10-CM | POA: Insufficient documentation

## 2018-06-28 DIAGNOSIS — G44229 Chronic tension-type headache, not intractable: Secondary | ICD-10-CM

## 2018-06-28 LAB — CK: Total CK: 49 U/L (ref 7–177)

## 2018-06-28 LAB — COMPREHENSIVE METABOLIC PANEL
ALT: 15 U/L (ref 0–35)
AST: 19 U/L (ref 0–37)
Albumin: 4.5 g/dL (ref 3.5–5.2)
Alkaline Phosphatase: 59 U/L (ref 39–117)
BUN: 7 mg/dL (ref 6–23)
CO2: 29 mEq/L (ref 19–32)
Calcium: 10.1 mg/dL (ref 8.4–10.5)
Chloride: 105 mEq/L (ref 96–112)
Creatinine, Ser: 0.68 mg/dL (ref 0.40–1.20)
GFR: 104.71 mL/min (ref >60.00)
Glucose, Bld: 98 mg/dL (ref 70–99)
Potassium: 4.3 mEq/L (ref 3.5–5.1)
Sodium: 140 mEq/L (ref 135–145)
Total Bilirubin: 0.6 mg/dL (ref 0.2–1.2)
Total Protein: 7.6 g/dL (ref 6.0–8.3)

## 2018-06-28 LAB — FERRITIN: Ferritin: 11.5 ng/mL (ref 10.0–291.0)

## 2018-06-28 LAB — CBC
HCT: 36.8 % (ref 36.0–46.0)
Hemoglobin: 12.4 g/dL (ref 12.0–15.0)
MCHC: 33.8 g/dL (ref 30.0–36.0)
MCV: 91.5 fl (ref 78.0–100.0)
Platelets: 290 10*3/uL (ref 150.0–400.0)
RBC: 4.02 Mil/uL (ref 3.87–5.11)
RDW: 13.9 % (ref 11.5–15.5)
WBC: 7.5 10*3/uL (ref 4.0–10.5)

## 2018-06-28 LAB — VITAMIN D 25 HYDROXY (VIT D DEFICIENCY, FRACTURES): VITD: 22.59 ng/mL — ABNORMAL LOW (ref 30.00–100.00)

## 2018-06-28 LAB — SEDIMENTATION RATE: Sed Rate: 44 mm/hr — ABNORMAL HIGH (ref 0–20)

## 2018-06-28 LAB — VITAMIN B12: Vitamin B-12: 832 pg/mL (ref 211–911)

## 2018-06-28 MED ORDER — ONDANSETRON HCL 4 MG PO TABS: 4.0000 mg | ORAL_TABLET | Freq: Three times a day (TID) | ORAL | 0 refills | Status: DC | PRN

## 2018-06-28 NOTE — Assessment & Plan Note (Signed)
She is now having daily headaches again and could be related to sinus pressure or infection. She wants to wait on antibiotics until we check labs. She can continue tylenol for pain if needed. Rx for zofran for nausea.

## 2018-06-28 NOTE — Progress Notes (Signed)
   Subjective:    Patient ID: Jane Torres, female    DOB: 07/06/1981, 37 y.o.   MRN: [id number]  HPI The patient is a 37 YO female coming in for headaches, chills, aches, nausea for about 10 days or so. She is also having some muscle aches in her thighs and they feel weak. More weak with walking and pain is worse with walking. She has been having some nausea and GERD as well and starting taking zantac which has helped some. She does have headaches daily. They vary in intensity. She has taken over the counter fever/headache medicine (likely tylenol she did not know name) which has helped. Taken 2-3 times in the last 10 days but only temporary and they come back. Her stomach symptoms are stable right now. She adds at the end of visit that her husband is gone for work the last 2 months and she is doing everything by herself. She is not sure if this is related.   Review of Systems  Constitutional: Positive for activity change, appetite change, chills, fatigue and fever. Negative for unexpected weight change.  HENT: Positive for congestion, ear pain, postnasal drip, rhinorrhea and sinus pressure. Negative for ear discharge, sinus pain, sneezing, sore throat, tinnitus, trouble swallowing and voice change.   Eyes: Negative.   Respiratory: Positive for cough. Negative for chest tightness, shortness of breath and wheezing.   Cardiovascular: Negative.  Negative for chest pain, palpitations and leg swelling.  Gastrointestinal: Positive for nausea. Negative for abdominal distention, abdominal pain, constipation, diarrhea and vomiting.       GERD  Musculoskeletal: Positive for arthralgias and myalgias.  Skin: Negative.   Neurological: Negative.   Psychiatric/Behavioral: Negative.       Objective:   Physical Exam  Constitutional: She is oriented to person, place, and time. She appears well-developed and well-nourished.  HENT:  Head: Normocephalic and atraumatic.  Oropharynx with redness and clear  drainage, nose with swollen turbinates, TMs bulging bilaterally  Eyes: EOM are normal.  Neck: Normal range of motion. No thyromegaly present.  Cardiovascular: Normal rate and regular rhythm.  Pulmonary/Chest: Effort normal and breath sounds normal. No respiratory distress. She has no wheezes. She has no rales.  Abdominal: Soft. Bowel sounds are normal. She exhibits no distension. There is no tenderness. There is no rebound.  Musculoskeletal: She exhibits tenderness. She exhibits no edema.  Lymphadenopathy:    She has no cervical adenopathy.  Neurological: She is alert and oriented to person, place, and time. Coordination normal.  Skin: Skin is warm and dry.   Vitals:   06/28/18 0825  BP: 118/74  Pulse: 78  Temp: 98.1 F (36.7 C)  TempSrc: Oral  SpO2: 98%  Weight: 114 lb (51.7 kg)  Height: 5' 3"  (1.6 m)      Assessment & Plan:

## 2018-06-28 NOTE — Assessment & Plan Note (Signed)
Suspect sinusitis as the cause of worsening headaches, pressure. She wants to wait for labs to see if she needs antibiotics. If elevated white count will do augmentin. If no elevation can try singulair for symptoms.

## 2018-06-28 NOTE — Assessment & Plan Note (Signed)
Suspect related to being essentially single parent for the last 2 months. Checking labs for vitamin B12, vitamin D, iron levels. CBC, CMP. Thyroid recently normal.

## 2018-06-28 NOTE — Assessment & Plan Note (Signed)
Taking zantac again which is fine. Suspect flare from sinus drainage.

## 2018-06-29 ENCOUNTER — Encounter: Payer: Self-pay | Admitting: Internal Medicine

## 2018-06-29 MED ORDER — AMOXICILLIN-POT CLAVULANATE 875-125 MG PO TABS: 1.0000 | ORAL_TABLET | Freq: Two times a day (BID) | ORAL | 0 refills | Status: DC

## 2018-07-02 LAB — ANA, IFA COMPREHENSIVE PANEL
Anti Nuclear Antibody(ANA): NEGATIVE
ENA SM Ab Ser-aCnc: <1 AI
SM/RNP: <1 AI
SSA (Ro) (ENA) Antibody, IgG: <1 AI
SSB (La) (ENA) Antibody, IgG: <1 AI
Scleroderma (Scl-70) (ENA) Antibody, IgG: <1 AI
ds DNA Ab: <1 IU/mL

## 2018-07-11 ENCOUNTER — Encounter

## 2018-07-11 ENCOUNTER — Ambulatory Visit (INDEPENDENT_AMBULATORY_CARE_PROVIDER_SITE_OTHER): Payer: 59 | Admitting: Gastroenterology

## 2018-07-11 VITALS — BP 102/76 | HR 80 | Ht 63.0 in | Wt 113.4 lb

## 2018-07-11 DIAGNOSIS — R110 Nausea: Secondary | ICD-10-CM | POA: Diagnosis not present

## 2018-07-11 DIAGNOSIS — R109 Unspecified abdominal pain: Secondary | ICD-10-CM

## 2018-07-11 DIAGNOSIS — K219 Gastro-esophageal reflux disease without esophagitis: Secondary | ICD-10-CM

## 2018-07-11 DIAGNOSIS — K5909 Other constipation: Secondary | ICD-10-CM

## 2018-07-11 DIAGNOSIS — K581 Irritable bowel syndrome with constipation: Secondary | ICD-10-CM

## 2018-07-11 MED ORDER — RANITIDINE HCL 150 MG PO TABS: 150.0000 mg | ORAL_TABLET | Freq: Every day | ORAL | 3 refills | Status: DC

## 2018-07-11 NOTE — Progress Notes (Signed)
Jane Torres    [id number]    June 19, 1981  Primary Care Physician:Crawford, Real Cons, MD  Referring Physician: Hoyt Koch, MD [place], [place] [address]  Chief complaint: Abdominal bloating, discomfort  HPI: 37 year old female here for follow-up visit with complaints of intermittent bloating and abdominal cramping.  She was doing well for past 6 months with good symptom control.  She started having flare of irritable bowel syndrome symptoms since she started taking antibiotics for sinus infection.  Her bowel habits have changed, is getting intermittent constipation and also she is having excessive bloating and abdominal cramps.  She is taking daily psyllium husk and is also taking Trunature probiotics. Denies any diarrhea, melena, blood in stool, vomiting or weight loss. She is adhering to gluten-free and lactose-free diet.  She has intermittent nausea and heartburn, worse in the past few weeks.   Outpatient Encounter Medications as of 07/11/2018  Medication Sig  . amoxicillin-clavulanate (AUGMENTIN) 875-125 MG tablet Take 1 tablet by mouth 2 (two) times daily.  . B-COMPLEX-C PO Take by mouth.  . dicyclomine (BENTYL) 10 MG capsule Take 1 capsule (10 mg total) by mouth 4 (four) times daily -  before meals and at bedtime.  . hyoscyamine (LEVSIN SL) 0.125 MG SL tablet Place 1 tablet (0.125 mg total) under the tongue 3 (three) times daily as needed.  . Multiple Vitamins-Minerals (MULTIVITAMIN ADULT PO) Take by mouth daily.  Marland Kitchen omeprazole (PRILOSEC) 20 MG capsule Take 20 mg by mouth daily.  . ondansetron (ZOFRAN) 4 MG tablet Take 1 tablet (4 mg total) by mouth every 8 (eight) hours as needed for nausea or vomiting.  . ranitidine (ZANTAC) 150 MG tablet Take 150 mg by mouth 2 (two) times daily.  . vitamin B-12 (CYANOCOBALAMIN) 100 MCG tablet Take 100 mcg by mouth daily.   No facility-administered encounter medications on file as of 07/11/2018.       Allergies as of 07/11/2018 - Review Complete 06/28/2018  Allergen Reaction Noted  . Gluten meal Nausea And Vomiting 11/05/2015    Past Medical History:  Diagnosis Date  . Allergy   . Anemia   . Anxiety   . Celiac disease   . Colon polyps   . GERD (gastroesophageal reflux disease)     Past Surgical History:  Procedure Laterality Date  . COLONOSCOPY    . UPPER GASTROINTESTINAL ENDOSCOPY      Family History  Problem Relation Age of Onset  . Colon cancer Neg Hx   . Esophageal cancer Neg Hx   . Rectal cancer Neg Hx   . Stomach cancer Neg Hx     Social History   Socioeconomic History  . Marital status: Married    Spouse name: Not on file  . Number of children: 1  . Years of education: Not on file  . Highest education level: Not on file  Occupational History  . Not on file  Social Needs  . Financial resource strain: Not on file  . Food insecurity:    Worry: Not on file    Inability: Not on file  . Transportation needs:    Medical: Not on file    Non-medical: Not on file  Tobacco Use  . Smoking status: Never Smoker  . Smokeless tobacco: Never Used  Substance and Sexual Activity  . Alcohol use: No    Alcohol/week: 0.0 standard drinks  . Drug use: No  . Sexual activity: Yes  Partners: Male    Birth control/protection: None  Lifestyle  . Physical activity:    Days per week: Not on file    Minutes per session: Not on file  . Stress: Not on file  Relationships  . Social connections:    Talks on phone: Not on file    Gets together: Not on file    Attends religious service: Not on file    Active member of club or organization: Not on file    Attends meetings of clubs or organizations: Not on file    Relationship status: Not on file  . Intimate partner violence:    Fear of current or ex partner: Not on file    Emotionally abused: Not on file    Physically abused: Not on file    Forced sexual activity: Not on file  Other Topics Concern  . Not on file   Social History Narrative  . Not on file      Review of systems: Review of Systems  Constitutional: Negative for fever and chills. Positive for lack of energy HENT: Positive for post nasal drip  Eyes: Negative for blurred vision.  Respiratory: Negative for cough, shortness of breath and wheezing.   Cardiovascular: Negative for chest pain and palpitations.  Gastrointestinal: as per HPI Genitourinary: Negative for dysuria, urgency, frequency and hematuria.  Musculoskeletal: Negative for myalgias, back pain and joint pain.  Skin: Negative for itching and rash.  Neurological: Negative for dizziness, tremors, focal weakness, seizures and loss of consciousness. Positive for frequent headaches Endo/Heme/Allergies: Positive for seasonal allergies.  Psychiatric/Behavioral: Negative for depression, suicidal ideas and hallucinations. Positive for anxiety All other systems reviewed and are negative.   Physical Exam: Vitals:   07/11/18 1020  BP: 102/76  Pulse: 80   Body mass index is 20.09 kg/m. Gen:      No acute distress HEENT:  EOMI, sclera anicteric Neck:     No masses; no thyromegaly Lungs:    Clear to auscultation bilaterally; normal respiratory effort CV:         Regular rate and rhythm; no murmurs Abd:      + bowel sounds; soft, non-tender; no palpable masses, no distension Ext:    No edema; adequate peripheral perfusion Skin:      Warm and dry; no rash Neuro: alert and oriented x 3 Psych: normal mood and affect  Data Reviewed:  Reviewed labs, radiology imaging, old records and pertinent past GI work up   Assessment and Plan/Recommendations:  37 year old female with history of irritable bowel syndrome and GERD here with complaints of intermittent nausea, heartburn, bloating, constipation with irregular bowel habits and abdominal cramps Advised patient to switch to soluble fiber, start Fiberchoice 1 tablet daily with breakfast and increase to 1 tablet 3 times daily with  meals as tolerated MiraLAX half capful daily at bedtime as needed Increase fluid intake Start omeprazole 20 mg daily in the morning, 30 minutes before breakfast and ranitidine 150 mg at bedtime as needed Continue probiotics Return in 3 months or sooner if needed  25 minutes was spent face-to-face with the patient. Greater than 50% of the time used for counseling as well as treatment plan and follow-up. She had multiple questions which were answered to her satisfaction  K. Denzil Magnuson , MD [phone]    CC: Hoyt Koch, *

## 2018-07-11 NOTE — Patient Instructions (Addendum)
Use Activia (Plain Yogurt)   Take VSL #3 112 Bunits 1 capsule daily for 30 days  Take Claritin over the counter daily as needed  Increase fluid intake  Take over the counter Miralax 1/2 capsul daily  Take omeprazole 20 mg daily in the mornings  Take Zantac at bedtime  Take Fiber choice 1 tablet daily with breakfast  Follow up in 3 months   If you are age 35 or older, your body mass index should be between 23-30. Your Body mass index is 20.09 kg/m. If this is out of the aforementioned range listed, please consider follow up with your Primary Care Provider.  If you are age 54 or younger, your body mass index should be between 19-25. Your Body mass index is 20.09 kg/m. If this is out of the aformentioned range listed, please consider follow up with your Primary Care Provider.   Thank you for choosing [facility]  Karleen Hampshire Nandigam,MD

## 2018-07-17 ENCOUNTER — Encounter: Payer: Self-pay | Admitting: Gastroenterology

## 2018-09-20 ENCOUNTER — Encounter: Payer: Self-pay | Admitting: Internal Medicine

## 2018-09-20 ENCOUNTER — Ambulatory Visit (INDEPENDENT_AMBULATORY_CARE_PROVIDER_SITE_OTHER): Payer: 59 | Admitting: Internal Medicine

## 2018-09-20 VITALS — BP 100/62 | HR 84 | Temp 98.2°F | Ht 63.0 in | Wt 114.0 lb

## 2018-09-20 DIAGNOSIS — Z23 Encounter for immunization: Secondary | ICD-10-CM | POA: Diagnosis not present

## 2018-09-20 DIAGNOSIS — R350 Frequency of micturition: Secondary | ICD-10-CM | POA: Diagnosis not present

## 2018-09-20 LAB — POCT URINALYSIS DIPSTICK
Bilirubin, UA: NEGATIVE
Blood, UA: NEGATIVE
Glucose, UA: NEGATIVE
Ketones, UA: NEGATIVE
Leukocytes, UA: NEGATIVE
Nitrite, UA: NEGATIVE
Protein, UA: NEGATIVE
Spec Grav, UA: 1.015 (ref 1.010–1.025)
Urobilinogen, UA: 0.2 E.U./dL
pH, UA: 6.5 (ref 5.0–8.0)

## 2018-09-20 MED ORDER — FLUCONAZOLE 150 MG PO TABS: 150.0000 mg | ORAL_TABLET | Freq: Once | ORAL | 0 refills | Status: AC

## 2018-09-20 NOTE — Assessment & Plan Note (Addendum)
POC U/A done in the office not consistent with infection. Rx for fluconazole to rule out yeast infection. Given information about ob/gyn and encouraged to make a visit prior to conception to make sure she is up to date on everything. She is taking pre-natal.

## 2018-09-20 NOTE — Progress Notes (Signed)
   Subjective:    Patient ID: Jane Torres, female    DOB: [birth year], 37 y.o.   MRN: [id number]  HPI The patient is a 37 YO female coming in for frequent urination. Started about 1 week ago. She just finished her period and then the urgency came back. She denies fevers or chills. Some pain with urination. Overall stable since onset. Denies vaginal pain or discharge. She is interested in having another child possibly and wants recommendation for ob/gyn as she does not have one now. She has had frequent yeast infection in the past. Mild white sticky discharge recently although she does have this from time to time.   Review of Systems  Constitutional: Negative.   Respiratory: Negative.   Cardiovascular: Negative.   Gastrointestinal: Positive for abdominal pain. Negative for abdominal distention, constipation, diarrhea, nausea and vomiting.  Genitourinary: Positive for dysuria, frequency, urgency and vaginal discharge. Negative for difficulty urinating, flank pain, hematuria, menstrual problem and pelvic pain.  Musculoskeletal: Negative.   Skin: Negative.       Objective:   Physical Exam  Constitutional: She is oriented to person, place, and time. She appears well-developed and well-nourished.  HENT:  Head: Normocephalic and atraumatic.  Eyes: EOM are normal.  Neck: Normal range of motion.  Cardiovascular: Normal rate and regular rhythm.  Pulmonary/Chest: Effort normal and breath sounds normal. No respiratory distress. She has no wheezes. She has no rales.  Abdominal: Soft. Bowel sounds are normal. She exhibits no distension. There is no tenderness. There is no rebound.  Musculoskeletal: She exhibits no edema.  Neurological: She is alert and oriented to person, place, and time. Coordination normal.  Skin: Skin is warm and dry.  Psychiatric: She has a normal mood and affect.   Vitals:   09/20/18 0931  BP: 100/62  Pulse: 84  Temp: 98.2 F (36.8 C)  TempSrc: Oral  SpO2: 99%    Weight: 114 lb (51.7 kg)  Height: 5' 3"  (1.6 m)      Assessment & Plan:  Flu shot given at visit

## 2018-09-20 NOTE — Patient Instructions (Addendum)
For the ob[facility] or [facility] are both great offices that can get you in.   We are going to treat you for yeast infection to see if this helps the symptoms as you do not have signs of urine infection today. We have sent in diflucan to take 1 pill today.    Stress and Stress Management Stress is a normal reaction to life events. It is what you feel when life demands more than you are used to or more than you can handle. Some stress can be useful. For example, the stress reaction can help you catch the last bus of the day, study for a test, or meet a deadline at work. But stress that occurs too often or for too long can cause problems. It can affect your emotional health and interfere with relationships and normal daily activities. Too much stress can weaken your immune system and increase your risk for physical illness. If you already have a medical problem, stress can make it worse. What are the causes? All sorts of life events may cause stress. An event that causes stress for one person may not be stressful for another person. Major life events commonly cause stress. These may be positive or negative. Examples include losing your job, moving into a new home, getting married, having a baby, or losing a loved one. Less obvious life events may also cause stress, especially if they occur day after day or in combination. Examples include working long hours, driving in traffic, caring for children, being in debt, or being in a difficult relationship. What are the signs or symptoms? Stress may cause emotional symptoms including, the following:  Anxiety. This is feeling worried, afraid, on edge, overwhelmed, or out of control.  Anger. This is feeling irritated or impatient.  Depression. This is feeling sad, down, helpless, or guilty.  Difficulty focusing, remembering, or making decisions.  Stress may cause physical symptoms, including the following:  Aches and pains. These may  affect your head, neck, back, stomach, or other areas of your body.  Tight muscles or clenched jaw.  Low energy or trouble sleeping.  Stress may cause unhealthy behaviors, including the following:  Eating to feel better (overeating) or skipping meals.  Sleeping too little, too much, or both.  Working too much or putting off tasks (procrastination).  Smoking, drinking alcohol, or using drugs to feel better.  How is this diagnosed? Stress is diagnosed through an assessment by your health care provider. Your health care provider will ask questions about your symptoms and any stressful life events.Your health care provider will also ask about your medical history and may order blood tests or other tests. Certain medical conditions and medicine can cause physical symptoms similar to stress. Mental illness can cause emotional symptoms and unhealthy behaviors similar to stress. Your health care provider may refer you to a mental health professional for further evaluation. How is this treated? Stress management is the recommended treatment for stress.The goals of stress management are reducing stressful life events and coping with stress in healthy ways. Techniques for reducing stressful life events include the following:  Stress identification. Self-monitor for stress and identify what causes stress for you. These skills may help you to avoid some stressful events.  Time management. Set your priorities, keep a calendar of events, and learn to say "no." These tools can help you avoid making too many commitments.  Techniques for coping with stress include the following:  Rethinking the problem. Try to think  realistically about stressful events rather than ignoring them or overreacting. Try to find the positives in a stressful situation rather than focusing on the negatives.  Exercise. Physical exercise can release both physical and emotional tension. The key is to find a form of exercise you  enjoy and do it regularly.  Relaxation techniques. These relax the body and mind. Examples include yoga, meditation, tai chi, biofeedback, deep breathing, progressive muscle relaxation, listening to music, being out in nature, journaling, and other hobbies. Again, the key is to find one or more that you enjoy and can do regularly.  Healthy lifestyle. Eat a balanced diet, get plenty of sleep, and do not smoke. Avoid using alcohol or drugs to relax.  Strong support network. Spend time with family, friends, or other people you enjoy being around.Express your feelings and talk things over with someone you trust.  Counseling or talktherapy with a mental health professional may be helpful if you are having difficulty managing stress on your own. Medicine is typically not recommended for the treatment of stress.Talk to your health care provider if you think you need medicine for symptoms of stress. Follow these instructions at home:  Keep all follow-up visits as directed by your health care provider.  Take all medicines as directed by your health care provider. Contact a health care provider if:  Your symptoms get worse or you start having new symptoms.  You feel overwhelmed by your problems and can no longer manage them on your own. Get help right away if:  You feel like hurting yourself or someone else. This information is not intended to replace advice given to you by your health care provider. Make sure you discuss any questions you have with your health care provider. Document Released: 04/26/2001 Document Revised: 04/07/2016 Document Reviewed: 06/25/2013 Elsevier Interactive Patient Education  2017 [employer].

## 2018-09-24 ENCOUNTER — Encounter: Payer: Self-pay | Admitting: Internal Medicine

## 2018-09-25 ENCOUNTER — Other Ambulatory Visit: Payer: Self-pay

## 2018-09-25 ENCOUNTER — Encounter: Payer: Self-pay | Admitting: Obstetrics and Gynecology

## 2018-09-25 ENCOUNTER — Telehealth: Payer: Self-pay | Admitting: Obstetrics and Gynecology

## 2018-09-25 ENCOUNTER — Encounter: Payer: Self-pay | Admitting: Internal Medicine

## 2018-09-25 ENCOUNTER — Ambulatory Visit (INDEPENDENT_AMBULATORY_CARE_PROVIDER_SITE_OTHER): Payer: 59 | Admitting: Obstetrics and Gynecology

## 2018-09-25 VITALS — BP 112/68 | HR 84 | Wt 117.8 lb

## 2018-09-25 DIAGNOSIS — R3915 Urgency of urination: Secondary | ICD-10-CM | POA: Diagnosis not present

## 2018-09-25 DIAGNOSIS — R3911 Hesitancy of micturition: Secondary | ICD-10-CM

## 2018-09-25 DIAGNOSIS — N760 Acute vaginitis: Secondary | ICD-10-CM | POA: Diagnosis not present

## 2018-09-25 DIAGNOSIS — R350 Frequency of micturition: Secondary | ICD-10-CM | POA: Diagnosis not present

## 2018-09-25 DIAGNOSIS — R300 Dysuria: Secondary | ICD-10-CM

## 2018-09-25 NOTE — Telephone Encounter (Signed)
Spoke with patient. Patient reports burning with urination, urgency and white vaginal d/c. Symptoms started 11/4, seen by PCP on 11/7, treated for yeast with diflucan x1, "urine negative". Patient states symptoms have not resolved, was advised to f/u with GYN for further evaluation. Denies fever/chills, irregular bleeding, N/V or pelvic pain.   OV scheduled for today at 4pm with Dr. Talbert Nan. Patient verbalizes understanding and is agreeable.   Encounter closed.

## 2018-09-25 NOTE — Progress Notes (Signed)
GYNECOLOGY  VISIT   HPI: 37 y.o.   Married American [place] or [place] Native Not Hispanic or [place]  female   [phone] with Patient's last menstrual period was 09/14/2018.   here for urinary symptoms that began last week. She has some bladder pressure, slight urinary frequency. She has a slow stream, not sure she is emptying for a couple of weeks. Slight suprapubic discomfort, no fever or flank pain. Gets harder to void/empty her bladder as the day goes on.  Reports she is also having vaginal burning, feels like the urine is irritating the skin. Slight itching. No change in vaginal d/c. She took a diflucan last week, didn't help.   GYNECOLOGIC HISTORY: Patient's last menstrual period was 09/14/2018. Contraception:None Menopausal hormone therapy: None        OB History    Gravida  2   Para  1   Term  1   Preterm      AB  1   Living  1     SAB  1   TAB      Ectopic      Multiple      Live Births  1              Patient Active Problem List   Diagnosis Date Noted  . Frequent urination 09/20/2018  . Sinusitis 06/28/2018  . Other fatigue 06/28/2018  . Irregular periods 01/02/2018  . Cold intolerance 09/01/2017  . Headache 09/01/2017  . Neck pain 09/01/2017  . GERD (gastroesophageal reflux disease) 09/11/2016  . Insomnia 09/11/2016  . Anxiety state 09/11/2016  . Loss of weight 04/21/2016  . Mild malnutrition ([place]) 04/21/2016  . Excessive flatus 01/11/2016  . Irritable bowel syndrome with diarrhea 01/11/2016  . Abdominal pain, lower 01/11/2016    Past Medical History:  Diagnosis Date  . Allergy   . Anemia   . Anxiety   . Celiac disease   . Colon polyps   . GERD (gastroesophageal reflux disease)     Past Surgical History:  Procedure Laterality Date  . COLONOSCOPY    . UPPER GASTROINTESTINAL ENDOSCOPY      Current Outpatient Medications  Medication Sig Dispense Refill  . dicyclomine (BENTYL) 10 MG capsule Take 1 capsule (10 mg total) by mouth 4  (four) times daily -  before meals and at bedtime. 60 capsule 0  . hyoscyamine (LEVSIN SL) 0.125 MG SL tablet Place 1 tablet (0.125 mg total) under the tongue 3 (three) times daily as needed. 30 tablet 0  . Multiple Vitamins-Minerals (MULTIVITAMIN ADULT PO) Take by mouth daily.    Marland Kitchen omeprazole (PRILOSEC) 20 MG capsule Take 20 mg by mouth daily.    . ranitidine (ZANTAC) 150 MG tablet Take 1 tablet (150 mg total) by mouth at bedtime. 30 tablet 3  . b complex vitamins tablet Take 1 tablet by mouth daily.    . B-COMPLEX-C PO Take by mouth.    . ondansetron (ZOFRAN) 4 MG tablet Take 1 tablet (4 mg total) by mouth every 8 (eight) hours as needed for nausea or vomiting. (Patient not taking: Reported on 09/25/2018) 20 tablet 0   No current facility-administered medications for this visit.      ALLERGIES: Gluten meal  Family History  Problem Relation Age of Onset  . Colon cancer Neg Hx   . Esophageal cancer Neg Hx   . Rectal cancer Neg Hx   . Stomach cancer Neg Hx     Social History   Socioeconomic History  .  Marital status: Married    Spouse name: Not on file  . Number of children: 1  . Years of education: Not on file  . Highest education level: Not on file  Occupational History  . Not on file  Social Needs  . Financial resource strain: Not on file  . Food insecurity:    Worry: Not on file    Inability: Not on file  . Transportation needs:    Medical: Not on file    Non-medical: Not on file  Tobacco Use  . Smoking status: Never Smoker  . Smokeless tobacco: Never Used  Substance and Sexual Activity  . Alcohol use: No    Alcohol/week: 0.0 standard drinks  . Drug use: No  . Sexual activity: Yes    Partners: Male    Birth control/protection: Condom  Lifestyle  . Physical activity:    Days per week: Not on file    Minutes per session: Not on file  . Stress: Not on file  Relationships  . Social connections:    Talks on phone: Not on file    Gets together: Not on file     Attends religious service: Not on file    Active member of club or organization: Not on file    Attends meetings of clubs or organizations: Not on file    Relationship status: Not on file  . Intimate partner violence:    Fear of current or ex partner: Not on file    Emotionally abused: Not on file    Physically abused: Not on file    Forced sexual activity: Not on file  Other Topics Concern  . Not on file  Social History Narrative  . Not on file    Review of Systems  Constitutional: Negative.   HENT: Negative.   Eyes: Negative.   Respiratory: Negative.   Cardiovascular: Negative.   Gastrointestinal: Negative.   Genitourinary: Positive for dysuria, frequency and urgency.       Vaginal itching  Musculoskeletal: Negative.   Skin: Negative.   Neurological: Negative.   Endo/Heme/Allergies: Negative.   Psychiatric/Behavioral: Negative.     PHYSICAL EXAMINATION:    BP 112/68 (BP Location: Right Arm, Patient Position: Sitting, Cuff Size: Normal)   Pulse 84   Wt 117 lb 12.8 oz (53.4 kg)   LMP 09/14/2018   BMI 20.87 kg/m     General appearance: alert, cooperative and appears stated age Abdomen: soft, mild supra pubic tenderness; non distended, no masses,  no organomegaly  Pelvic: External genitalia:  no lesions              Urethra:  normal appearing urethra with no masses, tenderness or lesions              Bartholins and Skenes: normal                 Vagina: normal appearing vagina with normal color and discharge, no lesions              Cervix: no lesions               Straight cath PVR: 28 cc  Chaperone was present for exam.  Wet prep: ? clue, no trich, few wbc KOH: no yeast PH: 4.5   ASSESSMENT Multiple urinary c/o, including hesitancy when voiding, frequency, urgency and intermittent pain. Normal PVR, negative urine dip Vulvovaginitis symptoms, normal exam, no clear etiology on exam    PLAN Send urine for ua, c&s, if negative, will  refer to Urology Send  nuswab for BV, c.albicans, c.galbrata, trich Can use Vaseline externally   An After Visit Summary was printed and given to the patient.  Over 15 minutes face to face time of which over 50% was spent in counseling.

## 2018-09-25 NOTE — Telephone Encounter (Signed)
Patient stated that she has been experiencing urgency to urinate and burning. Patient stated that she has been experiencing itching as well. Patient visited PCP on Thursday (09/20/18) and she was prescribed a medication but is not experiencing any relief.

## 2018-09-26 LAB — URINE CULTURE: Organism ID, Bacteria: NO GROWTH

## 2018-09-26 LAB — URINALYSIS, MICROSCOPIC ONLY
Bacteria, UA: NONE SEEN
Casts: NONE SEEN /lpf

## 2018-09-27 LAB — NUSWAB VAGINITIS (VG)
Candida albicans, NAA: NEGATIVE
Candida glabrata, NAA: NEGATIVE
Trich vag by NAA: NEGATIVE

## 2018-09-28 ENCOUNTER — Telehealth: Payer: Self-pay | Admitting: Emergency Medicine

## 2018-09-28 DIAGNOSIS — R3915 Urgency of urination: Secondary | ICD-10-CM

## 2018-09-28 DIAGNOSIS — R300 Dysuria: Secondary | ICD-10-CM

## 2018-09-28 DIAGNOSIS — R3911 Hesitancy of micturition: Secondary | ICD-10-CM

## 2018-09-28 DIAGNOSIS — R350 Frequency of micturition: Secondary | ICD-10-CM

## 2018-09-28 NOTE — Telephone Encounter (Signed)
Spoke with patient and message from Dr. Talbert Nan given.  Patient verbalizes understanding and accepts referral to urology.  Female provider is okay if only provider available.  Referral made to [facility] and patient advised she will be contacted with referral appointment.  Routing to [employer]  for referral. Encounter closed.

## 2018-09-28 NOTE — Telephone Encounter (Signed)
-----   Message from Salvadore Dom, MD sent at 09/27/2018  5:38 PM EST ----- Please inform the patient that her urine culture is negative for infection and refer her to Urology (see note from 09/25/18) Her vaginitis probe was also negative for infection.

## 2018-11-08 ENCOUNTER — Encounter: Payer: Self-pay | Admitting: Gastroenterology

## 2018-11-08 ENCOUNTER — Ambulatory Visit (INDEPENDENT_AMBULATORY_CARE_PROVIDER_SITE_OTHER): Payer: 59 | Admitting: Gastroenterology

## 2018-11-08 VITALS — BP 112/62 | HR 80 | Ht 63.25 in | Wt 120.0 lb

## 2018-11-08 DIAGNOSIS — K581 Irritable bowel syndrome with constipation: Secondary | ICD-10-CM

## 2018-11-08 DIAGNOSIS — K219 Gastro-esophageal reflux disease without esophagitis: Secondary | ICD-10-CM | POA: Diagnosis not present

## 2018-11-08 DIAGNOSIS — K5900 Constipation, unspecified: Secondary | ICD-10-CM | POA: Diagnosis not present

## 2018-11-08 NOTE — Progress Notes (Signed)
Jane Torres    [id number]    June 27, 1981  Primary Care Physician:Crawford, Real Cons, MD  Referring Physician: Hoyt Koch, MD [place], [place] [id number]  Chief complaint: Constipation  HPI: 3 yr F with history of chronic irritable bowel syndrome here for follow-up visit.  She was last seen in August 2019. GERD symptoms stable, with only occasional breakthrough heartburn once or twice a month.  She is having sinus congestion, runny nose and postnasal drip.  Occasional bright red blood when she does gargling or sinus rinse. Continues to have intermittent abdominal bloating and cramping.  Constipation has improved with daily Fiberchoice tablet.  Denies any nausea, vomiting, abdominal pain, melena or bright red blood per rectum    Outpatient Encounter Medications as of 11/08/2018  Medication Sig  . b complex vitamins tablet Take 1 tablet by mouth daily.  . B-COMPLEX-C PO Take by mouth.  . dicyclomine (BENTYL) 10 MG capsule Take 1 capsule (10 mg total) by mouth 4 (four) times daily -  before meals and at bedtime.  . hyoscyamine (LEVSIN SL) 0.125 MG SL tablet Place 1 tablet (0.125 mg total) under the tongue 3 (three) times daily as needed.  . Multiple Vitamins-Minerals (MULTIVITAMIN ADULT PO) Take by mouth daily.  Marland Kitchen omeprazole (PRILOSEC) 20 MG capsule Take 20 mg by mouth daily.  . ranitidine (ZANTAC) 150 MG tablet Take 1 tablet (150 mg total) by mouth at bedtime.  . [DISCONTINUED] ondansetron (ZOFRAN) 4 MG tablet Take 1 tablet (4 mg total) by mouth every 8 (eight) hours as needed for nausea or vomiting. (Patient not taking: Reported on 09/25/2018)   No facility-administered encounter medications on file as of 11/08/2018.     Allergies as of 11/08/2018 - Review Complete 11/08/2018  Allergen Reaction Noted  . Gluten meal Nausea And Vomiting 11/05/2015    Past Medical History:  Diagnosis Date  . Allergy   . Anemia   . Anxiety   .  Celiac disease   . Colon polyps   . GERD (gastroesophageal reflux disease)     Past Surgical History:  Procedure Laterality Date  . COLONOSCOPY    . UPPER GASTROINTESTINAL ENDOSCOPY      Family History  Problem Relation Age of Onset  . Colon cancer Neg Hx   . Esophageal cancer Neg Hx   . Rectal cancer Neg Hx   . Stomach cancer Neg Hx     Social History   Socioeconomic History  . Marital status: Married    Spouse name: Not on file  . Number of children: 1  . Years of education: Not on file  . Highest education level: Not on file  Occupational History  . Not on file  Social Needs  . Financial resource strain: Not on file  . Food insecurity:    Worry: Not on file    Inability: Not on file  . Transportation needs:    Medical: Not on file    Non-medical: Not on file  Tobacco Use  . Smoking status: Never Smoker  . Smokeless tobacco: Never Used  Substance and Sexual Activity  . Alcohol use: No    Alcohol/week: 0.0 standard drinks  . Drug use: No  . Sexual activity: Yes    Partners: Male    Birth control/protection: Condom  Lifestyle  . Physical activity:    Days per week: Not on file    Minutes per session: Not on file  .  Stress: Not on file  Relationships  . Social connections:    Talks on phone: Not on file    Gets together: Not on file    Attends religious service: Not on file    Active member of club or organization: Not on file    Attends meetings of clubs or organizations: Not on file    Relationship status: Not on file  . Intimate partner violence:    Fear of current or ex partner: Not on file    Emotionally abused: Not on file    Physically abused: Not on file    Forced sexual activity: Not on file  Other Topics Concern  . Not on file  Social History Narrative  . Not on file      Review of systems: Review of Systems  Constitutional: Negative for fever and chills.  HENT: Positive for sinus problem and postnasal drip Eyes: Negative for  blurred vision.  Respiratory: Negative for cough, shortness of breath and wheezing.   Cardiovascular: Negative for chest pain and palpitations.  Gastrointestinal: as per HPI Genitourinary: Negative for dysuria, urgency, frequency and hematuria.  Musculoskeletal: Positive for myalgias, back pain and joint pain.  Skin: Negative for itching and rash.  Neurological: Negative for dizziness, tremors, focal weakness, seizures and loss of consciousness.  Endo/Heme/Allergies: Positive for seasonal allergies.  Psychiatric/Behavioral: Negative for depression, suicidal ideas and hallucinations.  Positive for anxiety All other systems reviewed and are negative.   Physical Exam: Vitals:   11/08/18 1005  BP: 112/62  Pulse: 80   Body mass index is 21.09 kg/m. Gen:      No acute distress HEENT:  EOMI, sclera anicteric Neck:     No masses; no thyromegaly Lungs:    Clear to auscultation bilaterally; normal respiratory effort CV:         Regular rate and rhythm; no murmurs Abd:      + bowel sounds; soft, non-tender; no palpable masses, no distension Ext:    No edema; adequate peripheral perfusion Skin:      Warm and dry; no rash Neuro: alert and oriented x 3 Psych: normal mood and affect  Data Reviewed:  Reviewed labs, radiology imaging, old records and pertinent past GI work up   Assessment and Plan/Recommendations:  37 year old female with history of chronic irritable bowel syndrome and GERD here for follow-up visit IBS with constipation: Continue Fiberchoice 1 tablet daily.  MiraLAX 1 capful daily as needed Increase water intake to 8 to 10 cups daily  GERD: Pepcid 20 mg twice daily as needed Gaviscon 1 tablet up to 3 times daily as needed for postprandial reflux Antireflux measures  Her family is planning to relocate to [place], [place].  She will call to schedule follow-up visit if does not move within next 6 months  25 minutes was spent face-to-face with the patient. Greater  than 50% of the time used for counseling as well as treatment plan and follow-up. She had multiple questions which were answered to her satisfaction  K. Denzil Magnuson , MD [phone]    CC: Hoyt Koch, *

## 2018-11-08 NOTE — Patient Instructions (Signed)
If you are age 35 or older, your body mass index should be between 23-30. Your Body mass index is 21.09 kg/m. If this is out of the aforementioned range listed, please consider follow up with your Primary Care Provider.  If you are age 78 or younger, your body mass index should be between 19-25. Your Body mass index is 21.09 kg/m. If this is out of the aformentioned range listed, please consider follow up with your Primary Care Provider.    Pepcid 20 mg over the counter, twice a day.  Gaviscon over the counter 1 tablet, 3 times a day as needed.    Miralax over the counter daily as needed.   Continue Fiber-choice.   Gastroesophageal Reflux Disease, Adult Gastroesophageal reflux (GER) happens when acid from the stomach flows up into the tube that connects the mouth and the stomach (esophagus). Normally, food travels down the esophagus and stays in the stomach to be digested. With GER, food and stomach acid sometimes move back up into the esophagus. You may have a disease called gastroesophageal reflux disease (GERD) if the reflux:  Happens often.  Causes frequent or very bad symptoms.  Causes problems such as damage to the esophagus. When this happens, the esophagus becomes sore and swollen (inflamed). Over time, GERD can make small holes (ulcers) in the lining of the esophagus. What are the causes? This condition is caused by a problem with the muscle between the esophagus and the stomach. When this muscle is weak or not normal, it does not close properly to keep food and acid from coming back up from the stomach. The muscle can be weak because of:  Tobacco use.  Pregnancy.  Having a certain type of hernia (hiatal hernia).  Alcohol use.  Certain foods and drinks, such as coffee, chocolate, onions, and peppermint. What increases the risk? You are more likely to develop this condition if you:  Are overweight.  Have a disease that affects your connective tissue.  Use NSAID  medicines. What are the signs or symptoms? Symptoms of this condition include:  Heartburn.  Difficult or painful swallowing.  The feeling of having a lump in the throat.  A bitter taste in the mouth.  Bad breath.  Having a lot of saliva.  Having an upset or bloated stomach.  Belching.  Chest pain. Different conditions can cause chest pain. Make sure you see your doctor if you have chest pain.  Shortness of breath or noisy breathing (wheezing).  Ongoing (chronic) cough or a cough at night.  Wearing away of the surface of teeth (tooth enamel).  Weight loss. How is this treated? Treatment will depend on how bad your symptoms are. Your doctor may suggest:  Changes to your diet.  Medicine.  Surgery. Follow these instructions at home: Eating and drinking   Follow a diet as told by your doctor. You may need to avoid foods and drinks such as: ? Coffee and tea (with or without caffeine). ? Drinks that contain alcohol. ? Energy drinks and sports drinks. ? Bubbly (carbonated) drinks or sodas. ? Chocolate and cocoa. ? Peppermint and mint flavorings. ? Garlic and onions. ? Horseradish. ? Spicy and acidic foods. These include peppers, chili powder, curry powder, vinegar, hot sauces, and BBQ sauce. ? Citrus fruit juices and citrus fruits, such as oranges, lemons, and limes. ? Tomato-based foods. These include red sauce, chili, salsa, and pizza with red sauce. ? Fried and fatty foods. These include donuts, french fries, potato chips, and high-fat dressings. ?  High-fat meats. These include hot dogs, rib eye steak, sausage, ham, and bacon. ? High-fat dairy items, such as whole milk, butter, and cream cheese.  Eat small meals often. Avoid eating large meals.  Avoid drinking large amounts of liquid with your meals.  Avoid eating meals during the 2-3 hours before bedtime.  Avoid lying down right after you eat.  Do not exercise right after you eat. Lifestyle   Do not  use any products that contain nicotine or tobacco. These include cigarettes, e-cigarettes, and chewing tobacco. If you need help quitting, ask your doctor.  Try to lower your stress. If you need help doing this, ask your doctor.  If you are overweight, lose an amount of weight that is healthy for you. Ask your doctor about a safe weight loss goal. General instructions  Pay attention to any changes in your symptoms.  Take over-the-counter and prescription medicines only as told by your doctor. Do not take aspirin, ibuprofen, or other NSAIDs unless your doctor says it is okay.  Wear loose clothes. Do not wear anything tight around your waist.  Raise (elevate) the head of your bed about 6 inches (15 cm).  Avoid bending over if this makes your symptoms worse.  Keep all follow-up visits as told by your doctor. This is important. Contact a doctor if:  You have new symptoms.  You lose weight and you do not know why.  You have trouble swallowing or it hurts to swallow.  You have wheezing or a cough that keeps happening.  Your symptoms do not get better with treatment.  You have a hoarse voice. Get help right away if:  You have pain in your arms, neck, jaw, teeth, or back.  You feel sweaty, dizzy, or light-headed.  You have chest pain or shortness of breath.  You throw up (vomit) and your throw-up looks like blood or coffee grounds.  You pass out (faint).  Your poop (stool) is bloody or black.  You cannot swallow, drink, or eat. Summary  If a person has gastroesophageal reflux disease (GERD), food and stomach acid move back up into the esophagus and cause symptoms or problems such as damage to the esophagus.  Treatment will depend on how bad your symptoms are.  Follow a diet as told by your doctor.  Take all medicines only as told by your doctor. This information is not intended to replace advice given to you by your health care provider. Make sure you discuss any  questions you have with your health care provider. Document Released: 04/18/2008 Document Revised: 05/09/2018 Document Reviewed: 05/09/2018 Elsevier Interactive Patient Education  2019 [employer] for Gastroesophageal Reflux Disease, Adult When you have gastroesophageal reflux disease (GERD), the foods you eat and your eating habits are very important. Choosing the right foods can help ease your discomfort. Think about working with a nutrition specialist (dietitian) to help you make good choices. What are tips for following this plan?  Meals  Choose healthy foods that are low in fat, such as fruits, vegetables, whole grains, low-fat dairy products, and lean meat, fish, and poultry.  Eat small meals often instead of 3 large meals a day. Eat your meals slowly, and in a place where you are relaxed. Avoid bending over or lying down until 2-3 hours after eating.  Avoid eating meals 2-3 hours before bed.  Avoid drinking a lot of liquid with meals.  Cook foods using methods other than frying. Bake, grill, or broil  food instead.  Avoid or limit: ? Chocolate. ? Peppermint or spearmint. ? Alcohol. ? Pepper. ? Black and decaffeinated coffee. ? Black and decaffeinated tea. ? Bubbly (carbonated) soft drinks. ? Caffeinated energy drinks and soft drinks.  Limit high-fat foods such as: ? Fatty meat or fried foods. ? Whole milk, cream, butter, or ice cream. ? Nuts and nut butters. ? Pastries, donuts, and sweets made with butter or shortening.  Avoid foods that cause symptoms. These foods may be different for everyone. Common foods that cause symptoms include: ? Tomatoes. ? Oranges, lemons, and limes. ? Peppers. ? Spicy food. ? Onions and garlic. ? Vinegar. Lifestyle  Maintain a healthy weight. Ask your doctor what weight is healthy for you. If you need to lose weight, work with your doctor to do so safely.  Exercise for at least 30 minutes for 5 or more days each  week, or as told by your doctor.  Wear loose-fitting clothes.  Do not smoke. If you need help quitting, ask your doctor.  Sleep with the head of your bed higher than your feet. Use a wedge under the mattress or blocks under the bed frame to raise the head of the bed. Summary  When you have gastroesophageal reflux disease (GERD), food and lifestyle choices are very important in easing your symptoms.  Eat small meals often instead of 3 large meals a day. Eat your meals slowly, and in a place where you are relaxed.  Limit high-fat foods such as fatty meat or fried foods.  Avoid bending over or lying down until 2-3 hours after eating.  Avoid peppermint and spearmint, caffeine, alcohol, and chocolate. This information is not intended to replace advice given to you by your health care provider. Make sure you discuss any questions you have with your health care provider. Document Released: 05/01/2012 Document Revised: 12/06/2016 Document Reviewed: 12/06/2016 Elsevier Interactive Patient Education  2019 [employer].

## 2018-11-10 ENCOUNTER — Encounter: Payer: Self-pay | Admitting: Gastroenterology

## 2018-11-21 ENCOUNTER — Encounter: Payer: Self-pay | Admitting: Physician Assistant

## 2018-11-21 ENCOUNTER — Ambulatory Visit (INDEPENDENT_AMBULATORY_CARE_PROVIDER_SITE_OTHER): Payer: 59 | Admitting: Physician Assistant

## 2018-11-21 ENCOUNTER — Other Ambulatory Visit (INDEPENDENT_AMBULATORY_CARE_PROVIDER_SITE_OTHER): Payer: 59

## 2018-11-21 VITALS — BP 110/70 | HR 76 | Ht 63.0 in | Wt 118.0 lb

## 2018-11-21 DIAGNOSIS — K589 Irritable bowel syndrome without diarrhea: Secondary | ICD-10-CM

## 2018-11-21 DIAGNOSIS — R140 Abdominal distension (gaseous): Secondary | ICD-10-CM

## 2018-11-21 DIAGNOSIS — R109 Unspecified abdominal pain: Secondary | ICD-10-CM | POA: Diagnosis not present

## 2018-11-21 DIAGNOSIS — R1013 Epigastric pain: Secondary | ICD-10-CM

## 2018-11-21 LAB — COMPREHENSIVE METABOLIC PANEL
ALT: 9 U/L (ref 0–35)
AST: 15 U/L (ref 0–37)
Albumin: 4.5 g/dL (ref 3.5–5.2)
Alkaline Phosphatase: 72 U/L (ref 39–117)
BUN: 7 mg/dL (ref 6–23)
CO2: 28 mEq/L (ref 19–32)
Calcium: 9.7 mg/dL (ref 8.4–10.5)
Chloride: 105 mEq/L (ref 96–112)
Creatinine, Ser: 0.57 mg/dL (ref 0.40–1.20)
GFR: 128.06 mL/min (ref >60.00)
Glucose, Bld: 90 mg/dL (ref 70–99)
Potassium: 4 mEq/L (ref 3.5–5.1)
Sodium: 139 mEq/L (ref 135–145)
Total Bilirubin: 0.5 mg/dL (ref 0.2–1.2)
Total Protein: 7.5 g/dL (ref 6.0–8.3)

## 2018-11-21 LAB — CBC WITH DIFFERENTIAL/PLATELET
Basophils Absolute: 0 10*3/uL (ref 0.0–0.1)
Basophils Relative: 0.5 % (ref 0.0–3.0)
Eosinophils Absolute: 0 10*3/uL (ref 0.0–0.7)
Eosinophils Relative: 0.4 % (ref 0.0–5.0)
HCT: 37.1 % (ref 36.0–46.0)
Hemoglobin: 12.6 g/dL (ref 12.0–15.0)
Lymphocytes Relative: 18.8 % (ref 12.0–46.0)
Lymphs Abs: 1.4 10*3/uL (ref 0.7–4.0)
MCHC: 33.9 g/dL (ref 30.0–36.0)
MCV: 93.3 fl (ref 78.0–100.0)
Monocytes Absolute: 0.6 10*3/uL (ref 0.1–1.0)
Monocytes Relative: 8.1 % (ref 3.0–12.0)
Neutro Abs: 5.4 10*3/uL (ref 1.4–7.7)
Neutrophils Relative %: 72.2 % (ref 43.0–77.0)
Platelets: 261 10*3/uL (ref 150.0–400.0)
RBC: 3.98 Mil/uL (ref 3.87–5.11)
RDW: 13 % (ref 11.5–15.5)
WBC: 7.5 10*3/uL (ref 4.0–10.5)

## 2018-11-21 LAB — HIGH SENSITIVITY CRP: CRP, High Sensitivity: 0.55 mg/L (ref 0.000–5.000)

## 2018-11-21 LAB — SEDIMENTATION RATE: Sed Rate: 20 mm/hr (ref 0–20)

## 2018-11-21 MED ORDER — PANTOPRAZOLE SODIUM 40 MG PO TBEC: DELAYED_RELEASE_TABLET | ORAL | 4 refills | Status: DC

## 2018-11-21 MED ORDER — DICYCLOMINE HCL 10 MG PO CAPS: 10.0000 mg | ORAL_CAPSULE | Freq: Three times a day (TID) | ORAL | 6 refills | Status: DC

## 2018-11-21 NOTE — Progress Notes (Signed)
Subjective:    Patient ID: Jane Torres, female    DOB: Apr 24, 1981, 38 y.o.   MRN: [id number]  HPI Jane Torres is a pleasant 38 year old female, established with Dr. Nathaniel Man who was just seen in the office on 11/08/2018.  She has history of chronic IBS and GERD, and was feeling fairly well at that point, and maintained on her usual regimen. She comes in today eating but over the past 2 weeks she has developed abdominal cramping which she says came on fairly abruptly and what she describes as a "scratchy" abdominal pain/discomfort.  She also describes fullness in the upper abdomen, increased belching and burping, is intermittent nausea without vomiting, no fever or chills.  She has been having 1-2 bowel movements per day says after a bowel movement she will get some lower abdominal cramping and also a rectal cramping type feeling and some rectal pressure. Pepcid which she had been lysed to try at last visit was not helpful, she went back on omeprazole 20 mg daily and says that has been associated with some improvement.  She is also taking Bentyl 2-3 times daily as needed. She is worried about infection or inflammation.  She says she was treated for SIBO at one point in the past through another practice without any improvement in symptoms.  She also believes that she was positive for Giardia in the past when she was being seen at a practice in [place] and had been treated for that. She not had any recent travel, changes in medications antibiotics etc. and denies any increase in stress recently. EGD in August 2018 showed a small caliber esophagus with edema and congestion, punctate white folds and some linear erosions.  There was concern for eosinophilic esophagitis but biopsies were negative for any features of eosinophilic esophagitis, small bowel biopsies were negative. Colonoscopy as of 2018 with a 3 mm cecal polyp which was a sessile serrated polyp and also noted small internal  hemorrhoids.  Review of Systems Pertinent positive and negative review of systems were noted in the above HPI section.  All other review of systems was otherwise negative.  Outpatient Encounter Medications as of 11/21/2018  Medication Sig  . dicyclomine (BENTYL) 10 MG capsule Take 1 capsule (10 mg total) by mouth 3 (three) times daily before meals.  Marland Kitchen omeprazole (PRILOSEC) 20 MG capsule Take 20 mg by mouth daily.  . [DISCONTINUED] dicyclomine (BENTYL) 10 MG capsule Take 1 capsule (10 mg total) by mouth 4 (four) times daily -  before meals and at bedtime.  . pantoprazole (PROTONIX) 40 MG tablet Take 1 tablet by mouth every morningl.  . [DISCONTINUED] b complex vitamins tablet Take 1 tablet by mouth daily.  . [DISCONTINUED] B-COMPLEX-C PO Take by mouth.  . [DISCONTINUED] hyoscyamine (LEVSIN SL) 0.125 MG SL tablet Place 1 tablet (0.125 mg total) under the tongue 3 (three) times daily as needed.  . [DISCONTINUED] Multiple Vitamins-Minerals (MULTIVITAMIN ADULT PO) Take by mouth daily.  . [DISCONTINUED] ranitidine (ZANTAC) 150 MG tablet Take 1 tablet (150 mg total) by mouth at bedtime.   No facility-administered encounter medications on file as of 11/21/2018.    Allergies  Allergen Reactions  . Gluten Meal Nausea And Vomiting   Patient Active Problem List   Diagnosis Date Noted  . Frequent urination 09/20/2018  . Sinusitis 06/28/2018  . Other fatigue 06/28/2018  . Irregular periods 01/02/2018  . Cold intolerance 09/01/2017  . Headache 09/01/2017  . Neck pain 09/01/2017  . GERD (gastroesophageal reflux disease)  09/11/2016  . Insomnia 09/11/2016  . Anxiety state 09/11/2016  . Loss of weight 04/21/2016  . Mild malnutrition (HCC) 04/21/2016  . Excessive flatus 01/11/2016  . Irritable bowel syndrome with diarrhea 01/11/2016  . Abdominal pain, lower 01/11/2016   Social History   Socioeconomic History  . Marital status: Married    Spouse name: Not on file  . Number of children: 1  .  Years of education: Not on file  . Highest education level: Not on file  Occupational History  . Occupation: homemaker  Social Needs  . Financial resource strain: Not on file  . Food insecurity:    Worry: Not on file    Inability: Not on file  . Transportation needs:    Medical: Not on file    Non-medical: Not on file  Tobacco Use  . Smoking status: Never Smoker  . Smokeless tobacco: Never Used  Substance and Sexual Activity  . Alcohol use: No    Alcohol/week: 0.0 standard drinks  . Drug use: No  . Sexual activity: Yes    Partners: Male    Birth control/protection: Condom  Lifestyle  . Physical activity:    Days per week: Not on file    Minutes per session: Not on file  . Stress: Not on file  Relationships  . Social connections:    Talks on phone: Not on file    Gets together: Not on file    Attends religious service: Not on file    Active member of club or organization: Not on file    Attends meetings of clubs or organizations: Not on file    Relationship status: Not on file  . Intimate partner violence:    Fear of current or ex partner: Not on file    Emotionally abused: Not on file    Physically abused: Not on file    Forced sexual activity: Not on file  Other Topics Concern  . Not on file  Social History Narrative  . Not on file    Ms. Bressan family history is not on file.      Objective:    Vitals:   11/21/18 1107  BP: 110/70  Pulse: 76    Physical Exam  Well-developed young female in no acute distress, height 5 foot 3, weight 118, BMI 20.9.  HEENT; nontraumatic normocephalic EOMI PERRLA clear anicteric oral mucosa moist, Cardiovascular; regular rate and rhythm with S1-S2 no murmur rub or gallop, Pulmonary ;clear bilaterally, Abdomen; soft, she has rather generalized mild abdominal tenderness, no guarding or rebound no palpable mass or hepatosplenomegaly sounds are present, Rectal ;exam not done, Extremities; no clubbing cyanosis or edema skin warm  and dry, Neuropsych; alert and oriented, grossly nonfocal mood and affect appropriate       Assessment & Plan:   #20 38 year old female with history of chronic IBS and GERD who presents with 2-week history of abdominal cramping/pain epigastric fullness, increased belching, sensation of heaviness in her abdomen and intermittent rectal pressure/cramping. I suspect her current symptoms are secondary to exacerbation of IBS, consider testing for SIBO with breath test, will rule out giardiasis and Cryptosporidium, doubt IBD with negative colonoscopy a little over 1 year ago, rule out viral syndrome.  Suspect anxiety surrounding health issues is a factor.  #2 history of anxiety #3.  Sessile serrated polyp at colonoscopy August 2018 will need 5-year interval follow-up.  Plan; stop OTC omeprazole and start Protonix 40 mg p.o. every morning Advised to take Bentyl on a regular  basis 10 mg 3 times daily before meals Start IBgard 2 p.o. 3 times daily BC with differential, c-Met, sed rate CRP, stool for Giardia and Cryptosporidium, stool for lactoferrin. If above work-up is negative and symptoms do not improve over the next couple of weeks pursue breath testing for SIBO. She will follow-up with Dr. Lavon Paganini  in 4 to 6 weeks.  Anniebelle Devore S Mahli Glahn PA-C 11/21/2018   Cc: Myrlene Broker, *

## 2018-11-21 NOTE — Patient Instructions (Addendum)
Your provider has requested that you go to the basement level for lab work and stool studies before leaving today. Press "B" on the elevator. The lab is located at the first door on the left as you exit the elevator.  We sent prescriptions to [address]  1. Protonix 40 mg .2. Bentyl ( dicyclomine)  10 mg  We have given you samples of IB GARD. Take 2 capsules by mouth 3 times a daily.  Call us back the last week of January and make an appointment to see Dr. Silverio Decamp mid to late February or early March.    Normal BMI (Body Mass Index- based on height and weight) is between 19 and 25. Your BMI today is Body mass index is 20.9 kg/m. Marland Kitchen Please consider follow up  regarding your BMI with your Primary Care Provider.

## 2018-11-28 LAB — GIARDIA/CRYPTOSPORIDIUM (EIA)
MICRO NUMBER:: 46573
MICRO NUMBER:: 46574
RESULT:: NOT DETECTED
RESULT:: NOT DETECTED
SPECIMEN QUALITY:: ADEQUATE
SPECIMEN QUALITY:: ADEQUATE

## 2018-11-28 LAB — FECAL LACTOFERRIN, QUANT
Fecal Lactoferrin: NEGATIVE
MICRO NUMBER:: 46623
SPECIMEN QUALITY:: ADEQUATE

## 2019-01-29 ENCOUNTER — Encounter: Payer: Self-pay | Admitting: Internal Medicine

## 2019-01-30 MED ORDER — PANTOPRAZOLE SODIUM 20 MG PO TBEC: 20.0000 mg | DELAYED_RELEASE_TABLET | Freq: Every day | ORAL | 3 refills | Status: DC

## 2019-01-31 ENCOUNTER — Other Ambulatory Visit: Payer: Self-pay

## 2019-01-31 MED ORDER — PANTOPRAZOLE SODIUM 20 MG PO TBEC: 20.0000 mg | DELAYED_RELEASE_TABLET | Freq: Every day | ORAL | 3 refills | Status: DC

## 2019-05-20 ENCOUNTER — Encounter: Payer: Self-pay | Admitting: Internal Medicine

## 2019-05-20 ENCOUNTER — Ambulatory Visit (INDEPENDENT_AMBULATORY_CARE_PROVIDER_SITE_OTHER): Payer: 59 | Admitting: Internal Medicine

## 2019-05-20 ENCOUNTER — Other Ambulatory Visit (INDEPENDENT_AMBULATORY_CARE_PROVIDER_SITE_OTHER): Payer: 59

## 2019-05-20 ENCOUNTER — Other Ambulatory Visit: Payer: Self-pay

## 2019-05-20 VITALS — BP 110/78 | HR 94 | Temp 98.4°F | Ht 63.0 in | Wt 121.0 lb

## 2019-05-20 DIAGNOSIS — K580 Irritable bowel syndrome with diarrhea: Secondary | ICD-10-CM

## 2019-05-20 DIAGNOSIS — Z0000 Encounter for general adult medical examination without abnormal findings: Secondary | ICD-10-CM

## 2019-05-20 LAB — VITAMIN D 25 HYDROXY (VIT D DEFICIENCY, FRACTURES): VITD: 18.13 ng/mL — ABNORMAL LOW (ref 30.00–100.00)

## 2019-05-20 LAB — CBC
HCT: 35 % — ABNORMAL LOW (ref 36.0–46.0)
Hemoglobin: 11.8 g/dL — ABNORMAL LOW (ref 12.0–15.0)
MCHC: 33.8 g/dL (ref 30.0–36.0)
MCV: 91.6 fl (ref 78.0–100.0)
Platelets: 231 10*3/uL (ref 150.0–400.0)
RBC: 3.82 Mil/uL — ABNORMAL LOW (ref 3.87–5.11)
RDW: 13.5 % (ref 11.5–15.5)
WBC: 9.4 10*3/uL (ref 4.0–10.5)

## 2019-05-20 LAB — VITAMIN B12: Vitamin B-12: 339 pg/mL (ref 211–911)

## 2019-05-20 LAB — LIPID PANEL
Cholesterol: 130 mg/dL (ref 0–200)
HDL: 40.1 mg/dL (ref >39.00)
LDL Cholesterol: 74 mg/dL (ref 0–99)
NonHDL: 89.53
Total CHOL/HDL Ratio: 3
Triglycerides: 78 mg/dL (ref 0.0–149.0)
VLDL: 15.6 mg/dL (ref 0.0–40.0)

## 2019-05-20 LAB — FERRITIN: Ferritin: 9.7 ng/mL — ABNORMAL LOW (ref 10.0–291.0)

## 2019-05-20 LAB — T4, FREE: Free T4: 0.77 ng/dL (ref 0.60–1.60)

## 2019-05-20 LAB — TSH: TSH: 3.31 u[IU]/mL (ref 0.35–4.50)

## 2019-05-20 LAB — HEMOGLOBIN A1C: Hgb A1c MFr Bld: 5.5 % (ref 4.6–6.5)

## 2019-05-20 NOTE — Assessment & Plan Note (Signed)
Flu shot reminded. Tetanus up to date. Colonoscopy up to date. Pap smear up to date with gyn. Counseled about sun safety and mole surveillance. Counseled about the dangers of distracted driving. Given 10 year screening recommendations.

## 2019-05-20 NOTE — Patient Instructions (Signed)
Health Maintenance, Female Adopting a healthy lifestyle and getting preventive care are important in promoting health and wellness. Ask your health care provider about:  The right schedule for you to have regular tests and exams.  Things you can do on your own to prevent diseases and keep yourself healthy. What should I know about diet, weight, and exercise? Eat a healthy diet   Eat a diet that includes plenty of vegetables, fruits, low-fat dairy products, and lean protein.  Do not eat a lot of foods that are high in solid fats, added sugars, or sodium. Maintain a healthy weight Body mass index (BMI) is used to identify weight problems. It estimates body fat based on height and weight. Your health care provider can help determine your BMI and help you achieve or maintain a healthy weight. Get regular exercise Get regular exercise. This is one of the most important things you can do for your health. Most adults should:  Exercise for at least 150 minutes each week. The exercise should increase your heart rate and make you sweat (moderate-intensity exercise).  Do strengthening exercises at least twice a week. This is in addition to the moderate-intensity exercise.  Spend less time sitting. Even light physical activity can be beneficial. Watch cholesterol and blood lipids Have your blood tested for lipids and cholesterol at 36 years of age, then have this test every 5 years. Have your cholesterol levels checked more often if:  Your lipid or cholesterol levels are high.  You are older than 36 years of age.  You are at high risk for heart disease. What should I know about cancer screening? Depending on your health history and family history, you may need to have cancer screening at various ages. This may include screening for:  Breast cancer.  Cervical cancer.  Colorectal cancer.  Skin cancer.  Lung cancer. What should I know about heart disease, diabetes, and high blood  pressure? Blood pressure and heart disease  High blood pressure causes heart disease and increases the risk of stroke. This is more likely to develop in people who have high blood pressure readings, are of African descent, or are overweight.  Have your blood pressure checked: ? Every 3-5 years if you are 18-39 years of age. ? Every year if you are 40 years old or older. Diabetes Have regular diabetes screenings. This checks your fasting blood sugar level. Have the screening done:  Once every three years after age 40 if you are at a normal weight and have a low risk for diabetes.  More often and at a younger age if you are overweight or have a high risk for diabetes. What should I know about preventing infection? Hepatitis B If you have a higher risk for hepatitis B, you should be screened for this virus. Talk with your health care provider to find out if you are at risk for hepatitis B infection. Hepatitis C Testing is recommended for:  Everyone born from 1945 through 1965.  Anyone with known risk factors for hepatitis C. Sexually transmitted infections (STIs)  Get screened for STIs, including gonorrhea and chlamydia, if: ? You are sexually active and are younger than 36 years of age. ? You are older than 36 years of age and your health care provider tells you that you are at risk for this type of infection. ? Your sexual activity has changed since you were last screened, and you are at increased risk for chlamydia or gonorrhea. Ask your health care provider if   you are at risk.  Ask your health care provider about whether you are at high risk for HIV. Your health care provider may recommend a prescription medicine to help prevent HIV infection. If you choose to take medicine to prevent HIV, you should first get tested for HIV. You should then be tested every 3 months for as long as you are taking the medicine. Pregnancy  If you are about to stop having your period (premenopausal) and  you may become pregnant, seek counseling before you get pregnant.  Take 400 to 800 micrograms (mcg) of folic acid every day if you become pregnant.  Ask for birth control (contraception) if you want to prevent pregnancy. Osteoporosis and menopause Osteoporosis is a disease in which the bones lose minerals and strength with aging. This can result in bone fractures. If you are 65 years old or older, or if you are at risk for osteoporosis and fractures, ask your health care provider if you should:  Be screened for bone loss.  Take a calcium or vitamin D supplement to lower your risk of fractures.  Be given hormone replacement therapy (HRT) to treat symptoms of menopause. Follow these instructions at home: Lifestyle  Do not use any products that contain nicotine or tobacco, such as cigarettes, e-cigarettes, and chewing tobacco. If you need help quitting, ask your health care provider.  Do not use street drugs.  Do not share needles.  Ask your health care provider for help if you need support or information about quitting drugs. Alcohol use  Do not drink alcohol if: ? Your health care provider tells you not to drink. ? You are pregnant, may be pregnant, or are planning to become pregnant.  If you drink alcohol: ? Limit how much you use to 0-1 drink a day. ? Limit intake if you are breastfeeding.  Be aware of how much alcohol is in your drink. In the [place] one drink equals one 12 oz bottle of beer (355 mL), one 5 oz glass of wine (148 mL), or one 1 oz glass of hard liquor (44 mL). General instructions  Schedule regular health, dental, and eye exams.  Stay current with your vaccines.  Tell your health care provider if: ? You often feel depressed. ? You have ever been abused or do not feel safe at home. Summary  Adopting a healthy lifestyle and getting preventive care are important in promoting health and wellness.  Follow your health care provider's instructions about healthy  diet, exercising, and getting tested or screened for diseases.  Follow your health care provider's instructions on monitoring your cholesterol and blood pressure. This information is not intended to replace advice given to you by your health care provider. Make sure you discuss any questions you have with your health care provider. Document Released: 05/16/2011 Document Revised: 10/24/2018 Document Reviewed: 10/24/2018 Elsevier Patient Education  2020 [employer]  

## 2019-05-20 NOTE — Progress Notes (Signed)
   Subjective:   Patient ID: Jane Torres, female    DOB: [birth year], 38 y.o.   MRN: [id number]  HPI The patient is a 38 YO female coming in for physical. Still having GI issues. Takes protonix for 1 week every once in a while.   PMH, [facility], social history reviewed and updated.   Review of Systems  Constitutional: Negative.   HENT: Negative.   Eyes: Negative.   Respiratory: Negative for cough, chest tightness and shortness of breath.   Cardiovascular: Negative for chest pain, palpitations and leg swelling.  Gastrointestinal: Positive for abdominal distention, abdominal pain, blood in stool and constipation. Negative for diarrhea, nausea, rectal pain and vomiting.  Musculoskeletal: Negative.   Skin: Negative.   Neurological: Negative.   Psychiatric/Behavioral: Negative.    Objective:  Physical Exam Constitutional:      Appearance: She is well-developed.  HENT:     Head: Normocephalic and atraumatic.  Neck:     Musculoskeletal: Normal range of motion.  Cardiovascular:     Rate and Rhythm: Normal rate and regular rhythm.  Pulmonary:     Effort: Pulmonary effort is normal. No respiratory distress.     Breath sounds: Normal breath sounds. No wheezing or rales.  Abdominal:     General: Bowel sounds are normal. There is no distension.     Palpations: Abdomen is soft.     Tenderness: There is no abdominal tenderness. There is no rebound.  Skin:    General: Skin is warm and dry.  Neurological:     Mental Status: She is alert and oriented to person, place, and time.     Coordination: Coordination normal.    Vitals:   05/20/19 1329  BP: 110/78  Pulse: 94  Temp: 98.4 F (36.9 C)  TempSrc: Oral  SpO2: 98%  Weight: 121 lb (54.9 kg)  Height: 5' 3"  (1.6 m)   Assessment & Plan:

## 2019-05-20 NOTE — Assessment & Plan Note (Signed)
Alternating diarrhea and constipation at this time. Using bentyl prn cramping and protonix prn for 1 week for severe symptoms.

## 2019-05-21 ENCOUNTER — Other Ambulatory Visit: Payer: Self-pay | Admitting: Internal Medicine

## 2019-05-21 MED ORDER — VITAMIN D (ERGOCALCIFEROL) 1.25 MG (50000 UNIT) PO CAPS: 50000.0000 [IU] | ORAL_CAPSULE | ORAL | 1 refills | Status: DC

## 2019-06-03 ENCOUNTER — Encounter: Payer: Self-pay | Admitting: Internal Medicine

## 2019-06-03 ENCOUNTER — Other Ambulatory Visit: Payer: Self-pay | Admitting: Internal Medicine

## 2019-06-03 DIAGNOSIS — K2100 Gastro-esophageal reflux disease with esophagitis, without bleeding: Secondary | ICD-10-CM

## 2019-06-03 MED ORDER — PANTOPRAZOLE SODIUM 40 MG PO TBEC: 40.0000 mg | DELAYED_RELEASE_TABLET | Freq: Every day | ORAL | 0 refills | Status: DC

## 2019-06-03 NOTE — Telephone Encounter (Signed)
Md is out of the office this week. Pls advise on msg../lmb

## 2019-06-13 ENCOUNTER — Ambulatory Visit: Payer: 59 | Admitting: Obstetrics and Gynecology

## 2019-08-15 ENCOUNTER — Other Ambulatory Visit: Payer: Self-pay

## 2019-08-15 ENCOUNTER — Other Ambulatory Visit (INDEPENDENT_AMBULATORY_CARE_PROVIDER_SITE_OTHER): Payer: 59

## 2019-08-15 ENCOUNTER — Ambulatory Visit (INDEPENDENT_AMBULATORY_CARE_PROVIDER_SITE_OTHER): Payer: 59 | Admitting: Gastroenterology

## 2019-08-15 ENCOUNTER — Encounter: Payer: Self-pay | Admitting: Gastroenterology

## 2019-08-15 VITALS — BP 122/80 | HR 94 | Temp 98.7°F | Ht 63.0 in | Wt 119.0 lb

## 2019-08-15 DIAGNOSIS — K589 Irritable bowel syndrome without diarrhea: Secondary | ICD-10-CM | POA: Diagnosis not present

## 2019-08-15 DIAGNOSIS — R109 Unspecified abdominal pain: Secondary | ICD-10-CM

## 2019-08-15 DIAGNOSIS — R140 Abdominal distension (gaseous): Secondary | ICD-10-CM

## 2019-08-15 DIAGNOSIS — K219 Gastro-esophageal reflux disease without esophagitis: Secondary | ICD-10-CM

## 2019-08-15 DIAGNOSIS — K900 Celiac disease: Secondary | ICD-10-CM

## 2019-08-15 DIAGNOSIS — D508 Other iron deficiency anemias: Secondary | ICD-10-CM | POA: Diagnosis not present

## 2019-08-15 LAB — FERRITIN: Ferritin: 9.2 ng/mL — ABNORMAL LOW (ref 10.0–291.0)

## 2019-08-15 MED ORDER — PANTOPRAZOLE SODIUM 40 MG PO TBEC: 40.0000 mg | DELAYED_RELEASE_TABLET | Freq: Every day | ORAL | 3 refills | Status: DC

## 2019-08-15 MED ORDER — DICYCLOMINE HCL 20 MG PO TABS: 20.0000 mg | ORAL_TABLET | Freq: Three times a day (TID) | ORAL | 3 refills | Status: DC | PRN

## 2019-08-15 NOTE — Progress Notes (Signed)
Jane Torres    [id number]    04/30/1981  Primary Care Physician:Crawford, Real Cons, MD  Referring Physician: Hoyt Koch, MD [place],  [place] [id number]   Chief complaint:  Bloated, GERD  HPI: 38 yr F with history of chronic GERD, IBS and celiac disease for follow-up visit  She started having worsening abdominal bloating, epigastric discomfort and reflux symptoms in the past 1 to 2 months.  She recently started taking multivitamin with higher dose iron, organic, is listed as allergen free but is not certified gluten-free. She is having daily bowel movement with no blood or black stool.  No vomiting but has intermittent nausea with epigastric fullness.  Previously she was having only intermittent reflux symptoms and was taking Protonix as needed.  She started taking Prevacid over-the-counter but has not had much relief. Low ferritin 9.7.  Hemoglobin 11.8, hematocrit 35, vitamin B12 339 and vitamin D 18    Outpatient Encounter Medications as of 08/15/2019  Medication Sig  . dicyclomine (BENTYL) 10 MG capsule Take 1 capsule (10 mg total) by mouth 3 (three) times daily before meals.  . Famotidine (PEPCID PO) Take by mouth.  . [DISCONTINUED] pantoprazole (PROTONIX) 40 MG tablet Take 1 tablet (40 mg total) by mouth daily.  . [DISCONTINUED] Vitamin D, Ergocalciferol, (DRISDOL) 1.25 MG (50000 UT) CAPS capsule Take 1 capsule (50,000 Units total) by mouth every 7 (seven) days.   No facility-administered encounter medications on file as of 08/15/2019.     Allergies as of 08/15/2019 - Review Complete 05/20/2019  Allergen Reaction Noted  . Gluten meal Nausea And Vomiting 11/05/2015    Past Medical History:  Diagnosis Date  . Allergy   . Anemia   . Anxiety   . Celiac disease   . Colon polyps   . GERD (gastroesophageal reflux disease)     Past Surgical History:  Procedure Laterality Date  . COLONOSCOPY    . UPPER GASTROINTESTINAL  ENDOSCOPY      Family History  Problem Relation Age of Onset  . Colon cancer Neg Hx   . Esophageal cancer Neg Hx   . Rectal cancer Neg Hx   . Stomach cancer Neg Hx     Social History   Socioeconomic History  . Marital status: Married    Spouse name: Not on file  . Number of children: 1  . Years of education: Not on file  . Highest education level: Not on file  Occupational History  . Occupation: homemaker  Social Needs  . Financial resource strain: Not on file  . Food insecurity    Worry: Not on file    Inability: Not on file  . Transportation needs    Medical: Not on file    Non-medical: Not on file  Tobacco Use  . Smoking status: Never Smoker  . Smokeless tobacco: Never Used  Substance and Sexual Activity  . Alcohol use: No    Alcohol/week: 0.0 standard drinks  . Drug use: No  . Sexual activity: Yes    Partners: Male    Birth control/protection: Condom  Lifestyle  . Physical activity    Days per week: Not on file    Minutes per session: Not on file  . Stress: Not on file  Relationships  . Social Herbalist on phone: Not on file    Gets together: Not on file    Attends religious service: Not on  file    Active member of club or organization: Not on file    Attends meetings of clubs or organizations: Not on file    Relationship status: Not on file  . Intimate partner violence    Fear of current or ex partner: Not on file    Emotionally abused: Not on file    Physically abused: Not on file    Forced sexual activity: Not on file  Other Topics Concern  . Not on file  Social History Narrative  . Not on file      Review of systems: Review of Systems  Constitutional: Negative for fever and chills. Positive for weight loss, and loss of appetite HENT: Post nasal drip  Eyes: Negative for blurred vision.  Respiratory: Negative for cough, shortness of breath and wheezing.   Cardiovascular: Negative for chest pain and palpitations.   Gastrointestinal: as per HPI Genitourinary: Negative for dysuria, urgency, frequency and hematuria.  Musculoskeletal: Negative for myalgias, back pain and joint pain.  Skin: Negative for itching and rash.  Neurological: Negative for  tremors, focal weakness, seizures and loss of consciousness.  Endo/Heme/Allergies: Positive for seasonal allergies.  Psychiatric/Behavioral: Negative for depression, suicidal ideas and hallucinations. Positive for anxiety. All other systems reviewed and are negative.   Physical Exam: Vitals:   08/15/19 1104  BP: 122/80  Pulse: 94  Temp: 98.7 F (37.1 C)   Body mass index is 21.08 kg/m. Gen:      No acute distress HEENT:  EOMI, sclera anicteric Neck:     No masses; no thyromegaly Lungs:    Clear to auscultation bilaterally; normal respiratory effort CV:         Regular rate and rhythm; no murmurs Abd:      + bowel sounds; soft, non-tender; no palpable masses, no distension Ext:    No edema; adequate peripheral perfusion Skin:      Warm and dry; no rash Neuro: alert and oriented x 3 Psych: normal mood and affect  Data Reviewed:  Reviewed labs, radiology imaging, old records and pertinent past GI work up   Assessment and Plan/Recommendations:  38 year old female with history of IBS, celiac disease and chronic GERD Iron deficiency anemia  Discussed gluten-free diet and the need to avoid accidental cross-contamination.  Avoid over-the-counter herbal remedies  Advised patient to stop the multivitamin with high-dose iron, discussed reading labels and taking only gluten-free vitamins  We will recheck ferritin, if low will schedule IV iron Feraheme infusion X2  Start Protonix 40 mg daily Discussed antireflux measures  Dicyclomine 20 mg every 8 hours as needed for abdominal cramping and IBS symptoms  Trial of FD guard and IBgard for dyspepsia and IBS symptoms as needed up to 3 capsules daily  Return in 3 months or sooner if needed  25  minutes was spent face-to-face with the patient. Greater than 50% of the time used for counseling as well as treatment plan and follow-up. She had multiple questions which were answered to her satisfaction  K. Denzil Magnuson , MD    CC: Hoyt Koch, *

## 2019-08-15 NOTE — Patient Instructions (Addendum)
Go to the basement for labs today  AVOID Gluten  We will send in Protonix and Dicyclomine to your pharmacy   Take FD/IBGard three times as needed   I appreciate the  opportunity to care for you  Thank You   Harl Bowie , MD

## 2019-08-16 ENCOUNTER — Other Ambulatory Visit: Payer: Self-pay

## 2019-08-16 ENCOUNTER — Telehealth: Payer: Self-pay

## 2019-08-16 DIAGNOSIS — D508 Other iron deficiency anemias: Secondary | ICD-10-CM

## 2019-08-16 DIAGNOSIS — K900 Celiac disease: Secondary | ICD-10-CM

## 2019-08-16 NOTE — Telephone Encounter (Signed)
Tentative infusion date is 09/03/19 at 8:00 am.

## 2019-08-16 NOTE — Telephone Encounter (Signed)
ok 

## 2019-08-16 NOTE — Telephone Encounter (Signed)
Insurance does not cover the Feraheme infusion. I have been told by our referral coordinator that the patient has not met her deductible. The infusion will be very expensive. I am moving the infusion date out 2 weeks. Referral coordinator is trying to find out what is covered. The patient is aware. She will stop her supplement for now.

## 2019-08-19 NOTE — Telephone Encounter (Signed)
Pt is requesting to speak with nurse about alternatives to iron infusions.

## 2019-08-19 NOTE — Telephone Encounter (Signed)
Insurance "will cover the infusion" as understood by the patient. Also is aware that the deductible has to be met before insurance begins to pay their adjusted amount.  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 Patient's husband understand this. Wants the appointment moved to a sooner time than 09/03/19.

## 2019-08-20 ENCOUNTER — Encounter (HOSPITAL_COMMUNITY): Payer: 59

## 2019-08-22 NOTE — Telephone Encounter (Signed)
Check with [facility]. No earlier appointments.

## 2019-09-03 ENCOUNTER — Ambulatory Visit (HOSPITAL_COMMUNITY)
Admission: RE | Admit: 2019-09-03 | Discharge: 2019-09-03 | Disposition: A | Discharge status: Home / Self Care | Payer: 59 | Source: Ambulatory Visit | Attending: Internal Medicine | Admitting: Internal Medicine

## 2019-09-03 ENCOUNTER — Other Ambulatory Visit: Payer: Self-pay

## 2019-09-03 DIAGNOSIS — K900 Celiac disease: Secondary | ICD-10-CM | POA: Diagnosis not present

## 2019-09-03 DIAGNOSIS — D508 Other iron deficiency anemias: Secondary | ICD-10-CM | POA: Insufficient documentation

## 2019-09-03 MED ORDER — SODIUM CHLORIDE 0.9 % IV SOLN
INTRAVENOUS | Status: DC | PRN
  Administered 2019-09-03: 250 mL via INTRAVENOUS

## 2019-09-03 MED ORDER — SODIUM CHLORIDE 0.9 % IV SOLN
510.0000 mg | INTRAVENOUS | Status: DC
  Administered 2019-09-03: 510 mg via INTRAVENOUS
  Filled 2019-09-03: qty 17

## 2019-09-03 NOTE — Progress Notes (Signed)
                   Patient Care Center Note   Diagnosis: Iron Deficiency Anemia   Provider: Orion Modest MD   Procedure: IV Feraheme   Note: Received IV Feraheme via PIV. Tolerated well.. Observed 30 minutes post-transfusion. Vitals stable. BP pressure noted low pre and post infusion. Patient report no symptoms.  Discharge instructions given. Alert, oriented and ambulatory at discharge.   Otho Bellows, RN

## 2019-09-03 NOTE — Discharge Instructions (Signed)

## 2019-09-09 ENCOUNTER — Other Ambulatory Visit: Payer: Self-pay

## 2019-09-09 MED ORDER — RIFAXIMIN 550 MG PO TABS: 550.0000 mg | ORAL_TABLET | Freq: Three times a day (TID) | ORAL | 0 refills | Status: AC

## 2019-09-10 ENCOUNTER — Other Ambulatory Visit: Payer: Self-pay

## 2019-09-10 ENCOUNTER — Ambulatory Visit (HOSPITAL_COMMUNITY)
Admission: RE | Admit: 2019-09-10 | Discharge: 2019-09-10 | Disposition: A | Discharge status: Home / Self Care | Payer: 59 | Source: Ambulatory Visit | Attending: Internal Medicine | Admitting: Internal Medicine

## 2019-09-10 DIAGNOSIS — D508 Other iron deficiency anemias: Secondary | ICD-10-CM | POA: Diagnosis not present

## 2019-09-10 MED ORDER — SODIUM CHLORIDE 0.9 % IV SOLN
510.0000 mg | Freq: Once | INTRAVENOUS | Status: AC
  Administered 2019-09-10: 510 mg via INTRAVENOUS
  Filled 2019-09-10: qty 17

## 2019-09-10 MED ORDER — SODIUM CHLORIDE 0.9 % IV SOLN
INTRAVENOUS | Status: DC | PRN
  Administered 2019-09-10: 250 mL via INTRAVENOUS

## 2019-09-10 NOTE — Discharge Instructions (Signed)

## 2019-09-10 NOTE — Progress Notes (Signed)
Patient Care Center Note   Diagnosis: Iron Deficiency Anemia   Provider: Orion Modest MD   Procedure: IV Feraheme   Note: Received IV Feraheme via PIV. Tolerated well.. Observed 30 minutes post-transfusion. Vitals stable. Patient report no symptoms.  Discharge instructions given. Alert, oriented and ambulatory at discharge.

## 2019-09-16 ENCOUNTER — Ambulatory Visit: Payer: 59 | Admitting: Gastroenterology

## 2019-10-17 ENCOUNTER — Other Ambulatory Visit (INDEPENDENT_AMBULATORY_CARE_PROVIDER_SITE_OTHER): Payer: 59

## 2019-10-17 ENCOUNTER — Other Ambulatory Visit: Payer: Self-pay

## 2019-10-17 ENCOUNTER — Encounter: Payer: Self-pay | Admitting: Gastroenterology

## 2019-10-17 ENCOUNTER — Ambulatory Visit (INDEPENDENT_AMBULATORY_CARE_PROVIDER_SITE_OTHER): Payer: 59 | Admitting: Gastroenterology

## 2019-10-17 VITALS — BP 100/70 | HR 80 | Temp 98.5°F | Ht 63.5 in | Wt 112.1 lb

## 2019-10-17 DIAGNOSIS — R197 Diarrhea, unspecified: Secondary | ICD-10-CM

## 2019-10-17 DIAGNOSIS — K900 Celiac disease: Secondary | ICD-10-CM

## 2019-10-17 DIAGNOSIS — K580 Irritable bowel syndrome with diarrhea: Secondary | ICD-10-CM | POA: Diagnosis not present

## 2019-10-17 DIAGNOSIS — K589 Irritable bowel syndrome without diarrhea: Secondary | ICD-10-CM | POA: Diagnosis not present

## 2019-10-17 DIAGNOSIS — R109 Unspecified abdominal pain: Secondary | ICD-10-CM | POA: Diagnosis not present

## 2019-10-17 DIAGNOSIS — R140 Abdominal distension (gaseous): Secondary | ICD-10-CM

## 2019-10-17 LAB — COMPREHENSIVE METABOLIC PANEL
ALT: 16 U/L (ref 0–35)
AST: 17 U/L (ref 0–37)
Albumin: 4.4 g/dL (ref 3.5–5.2)
Alkaline Phosphatase: 67 U/L (ref 39–117)
BUN: 6 mg/dL (ref 6–23)
CO2: 28 mEq/L (ref 19–32)
Calcium: 9.6 mg/dL (ref 8.4–10.5)
Chloride: 105 mEq/L (ref 96–112)
Creatinine, Ser: 0.61 mg/dL (ref 0.40–1.20)
GFR: 110.85 mL/min (ref >60.00)
Glucose, Bld: 102 mg/dL — ABNORMAL HIGH (ref 70–99)
Potassium: 3.6 mEq/L (ref 3.5–5.1)
Sodium: 141 mEq/L (ref 135–145)
Total Bilirubin: 0.7 mg/dL (ref 0.2–1.2)
Total Protein: 7.3 g/dL (ref 6.0–8.3)

## 2019-10-17 LAB — IBC + FERRITIN
Ferritin: 269.1 ng/mL (ref 10.0–291.0)
Iron: 80 ug/dL (ref 42–145)
Saturation Ratios: 29.6 % (ref 20.0–50.0)
Transferrin: 193 mg/dL — ABNORMAL LOW (ref 212.0–360.0)

## 2019-10-17 LAB — FOLATE: Folate: 15.5 ng/mL (ref >5.9)

## 2019-10-17 LAB — VITAMIN B12: Vitamin B-12: 382 pg/mL (ref 211–911)

## 2019-10-17 LAB — TSH: TSH: 2.75 u[IU]/mL (ref 0.35–4.50)

## 2019-10-17 NOTE — Progress Notes (Signed)
Jane Torres    [id number]    05-16-1981  Primary Care Physician:Crawford, Real Cons, MD  Referring Physician: Hoyt Koch, MD [place],   [phone]   Chief complaint:  Weight loss  HPI:  38 year old female with history of chronic GERD, celiac disease and irritable bowel syndrome here for follow-up visit She is following strict gluten-free diet.  But continues to have intermittent abdominal cramping and increased bowel frequency almost daily for past few months. Denies any accidental gluten exposure. No vomiting but has intermittent nausea.  She has diffuse abdominal cramping worse in the lower abdomen and on the left side.  No melena or blood in stool. She is eating, denies any loss of appetite but has been unable to gain any weight, continues to progressively lose weight and has lost about 7 pounds in the past 2 months. She is under significant stress and anxiety during this pandemic, feels really tired and weak.  Review of data July 2020 low ferritin 9.7.  Hemoglobin 11.8, hematocrit 35, vitamin B12 339 and vitamin D 18 She was positive for HLA DQ 2 and negative for HLA DQ 8 Stool negative for Giardia and cryptosporidia Fecal lactoferrin negative Celiac panel negative August 2019 on gluten-free diet  Outpatient Encounter Medications as of 10/17/2019  Medication Sig  . dicyclomine (BENTYL) 10 MG capsule Take 1 capsule (10 mg total) by mouth 3 (three) times daily before meals.  . dicyclomine (BENTYL) 20 MG tablet Take 1 tablet (20 mg total) by mouth every 8 (eight) hours as needed for spasms.  . Famotidine (PEPCID PO) Take by mouth.  . pantoprazole (PROTONIX) 40 MG tablet Take 1 tablet (40 mg total) by mouth daily.   No facility-administered encounter medications on file as of 10/17/2019.     Allergies as of 10/17/2019 - Review Complete 08/15/2019  Allergen Reaction Noted  . Gluten meal Nausea And Vomiting 11/05/2015    Past Medical History:  Diagnosis Date  . Allergy   . Anemia   . Anxiety   . Celiac disease   . Colon polyps   . GERD (gastroesophageal reflux disease)     Past Surgical History:  Procedure Laterality Date  . COLONOSCOPY    . UPPER GASTROINTESTINAL ENDOSCOPY      Family History  Problem Relation Age of Onset  . Colon cancer Neg Hx   . Esophageal cancer Neg Hx   . Rectal cancer Neg Hx   . Stomach cancer Neg Hx     Social History   Socioeconomic History  . Marital status: Married    Spouse name: Not on file  . Number of children: 1  . Years of education: Not on file  . Highest education level: Not on file  Occupational History  . Occupation: homemaker  Social Needs  . Financial resource strain: Not on file  . Food insecurity    Worry: Not on file    Inability: Not on file  . Transportation needs    Medical: Not on file    Non-medical: Not on file  Tobacco Use  . Smoking status: Never Smoker  . Smokeless tobacco: Never Used  Substance and Sexual Activity  . Alcohol use: No    Alcohol/week: 0.0 standard drinks  . Drug use: No  . Sexual activity: Yes    Partners: Male    Birth control/protection: Condom  Lifestyle  . Physical activity    Days per week:  Not on file    Minutes per session: Not on file  . Stress: Not on file  Relationships  . Social Herbalist on phone: Not on file    Gets together: Not on file    Attends religious service: Not on file    Active member of club or organization: Not on file    Attends meetings of clubs or organizations: Not on file    Relationship status: Not on file  . Intimate partner violence    Fear of current or ex partner: Not on file    Emotionally abused: Not on file    Physically abused: Not on file    Forced sexual activity: Not on file  Other Topics Concern  . Not on file  Social History Narrative  . Not on file      Review of systems: Review of Systems  Constitutional: Negative for fever and  chills.  HENT: Post nasal drip Eyes: Negative for blurred vision.  Respiratory: Negative for cough, shortness of breath and wheezing.   Cardiovascular: Negative for chest pain and palpitations.  Gastrointestinal: as per HPI Genitourinary: Negative for dysuria, urgency, frequency and hematuria.  Musculoskeletal: Positive for myalgias, back pain and joint pain.  Skin: Negative for itching and rash.  Neurological: Negative for dizziness, tremors, focal weakness, seizures and loss of consciousness.  Endo/Heme/Allergies: Positive for seasonal allergies.  Psychiatric/Behavioral: Negative for depression, suicidal ideas and hallucinations. Positive for anxiety. All other systems reviewed and are negative.   Physical Exam: Vitals:   10/17/19 0814  Temp: 98.5 F (36.9 C)   Body mass index is 19.55 kg/m. Gen:      No acute distress HEENT:  EOMI, sclera anicteric Neck:     No masses; no thyromegaly Lungs:    Clear to auscultation bilaterally; normal respiratory effort CV:         Regular rate and rhythm; no murmurs Abd:      + bowel sounds; soft, left side upper and lower quadrant-tender; no palpable masses, no distension Ext:    No edema; adequate peripheral perfusion Skin:      Warm and dry; no rash Neuro: alert and oriented x 3 Psych: normal mood and affect  Data Reviewed:  Reviewed labs, radiology imaging, old records and pertinent past GI work up   Assessment and Plan/Recommendations:  38 year old female with history of celiac disease, chronic GERD, iron deficiency anemia and IBS predominant diarrhea with progressive weight loss, diarrhea and abdominal pain  Will obtain CT abdomen and pelvis with contrast to exclude any GI pathology/IBD  Continue gluten-free diet  Continue dicyclomine 20 mg  Check fecal elastase and fecal fat to exclude pancreatic insufficiency  Check fecal lactoferrin to exclude colitis  Fatigue and weakness: Had borderline low B12 level previously,  will recheck it Also check CBC and CMP  History of iron deficiency: Continue oral iron  IBS predominant diarrhea: Trial of Creon 1 capsule 3 times daily with meals and with snacks  Return in 3 months or sooner if needed  25 minutes was spent face-to-face with the patient. Greater than 50% of the time used for counseling as well as treatment plan and follow-up. She had multiple questions which were answered to her satisfaction  K. Denzil Magnuson , MD    CC: Hoyt Koch, *

## 2019-10-17 NOTE — Patient Instructions (Signed)
You have been scheduled for a CT scan of the abdomen and pelvis at [facility] ([address]---this is in the same building as [employer]).   You are scheduled on 10/22/2019 at 2pm. You should arrive 15 minutes prior to your appointment time for registration. Please follow the written instructions below on the day of your exam:  WARNING: IF YOU ARE ALLERGIC TO IODINE/X-RAY DYE, PLEASE NOTIFY RADIOLOGY IMMEDIATELY AT [phone]! YOU WILL BE GIVEN A 13 HOUR PREMEDICATION PREP.  1) Do not eat or drink anything after 10am (4 hours prior to your test) 2) You have been given 2 bottles of oral contrast to drink. The solution may taste better if refrigerated, but do NOT add ice or any other liquid to this solution. Shake well before drinking.    Drink 1 bottle of contrast @ 12pm (2 hours prior to your exam)  Drink 1 bottle of contrast @ 1pm (1 hour prior to your exam)  You may take any medications as prescribed with a small amount of water, if necessary. If you take any of the following medications: METFORMIN, GLUCOPHAGE, GLUCOVANCE, AVANDAMET, RIOMET, FORTAMET, [place] MET, JANUMET, GLUMETZA or METAGLIP, you MAY be asked to HOLD this medication 48 hours AFTER the exam.  The purpose of you drinking the oral contrast is to aid in the visualization of your intestinal tract. The contrast solution may cause some diarrhea. Depending on your individual set of symptoms, you may also receive an intravenous injection of x-ray contrast/dye. Plan on being at [facility] for 30 minutes or longer, depending on the type of exam you are having performed.  This test typically takes 30-45 minutes to complete.  If you have any questions regarding your exam or if you need to reschedule, you may call the CT department at [phone] between the hours of 8:00 am and 5:00 pm, Monday-Friday.   Go to the basement for labs today  Take Creon samples 1 capsule with meals daily until samples  are complete  Follow up in 3 months  I appreciate the  opportunity to care for you  Thank You   Harl Bowie , MD    ________________________________________________________________________

## 2019-10-18 ENCOUNTER — Other Ambulatory Visit: Payer: 59

## 2019-10-18 ENCOUNTER — Encounter: Payer: Self-pay | Admitting: Gastroenterology

## 2019-10-18 DIAGNOSIS — R109 Unspecified abdominal pain: Secondary | ICD-10-CM

## 2019-10-18 DIAGNOSIS — R197 Diarrhea, unspecified: Secondary | ICD-10-CM

## 2019-10-18 DIAGNOSIS — K589 Irritable bowel syndrome without diarrhea: Secondary | ICD-10-CM

## 2019-10-18 DIAGNOSIS — R140 Abdominal distension (gaseous): Secondary | ICD-10-CM

## 2019-10-21 LAB — FECAL FAT, QUALITATIVE
Fat Qual Neutral, Stl: NORMAL
Fat Qual Total, Stl: NORMAL

## 2019-10-22 ENCOUNTER — Other Ambulatory Visit: Payer: Self-pay

## 2019-10-22 ENCOUNTER — Ambulatory Visit (INDEPENDENT_AMBULATORY_CARE_PROVIDER_SITE_OTHER)
Admission: RE | Admit: 2019-10-22 | Discharge: 2019-10-22 | Disposition: A | Discharge status: Home / Self Care | Payer: 59 | Source: Ambulatory Visit | Attending: Gastroenterology | Admitting: Gastroenterology

## 2019-10-22 DIAGNOSIS — R140 Abdominal distension (gaseous): Secondary | ICD-10-CM

## 2019-10-22 DIAGNOSIS — R197 Diarrhea, unspecified: Secondary | ICD-10-CM

## 2019-10-22 DIAGNOSIS — K580 Irritable bowel syndrome with diarrhea: Secondary | ICD-10-CM | POA: Diagnosis not present

## 2019-10-22 DIAGNOSIS — R109 Unspecified abdominal pain: Secondary | ICD-10-CM

## 2019-10-22 MED ORDER — IOHEXOL 300 MG/ML  SOLN
100.0000 mL | Freq: Once | INTRAMUSCULAR | Status: AC | PRN
  Administered 2019-10-22: 100 mL via INTRAVENOUS

## 2019-10-28 LAB — FECAL LACTOFERRIN, QUANT
Fecal Lactoferrin: NEGATIVE
MICRO NUMBER:: 1164838
SPECIMEN QUALITY:: ADEQUATE

## 2019-10-28 LAB — PANCREATIC ELASTASE, FECAL: Pancreatic Elastase-1, Stool: >500 mcg/g

## 2019-12-04 ENCOUNTER — Ambulatory Visit: Payer: Self-pay | Admitting: *Deleted

## 2019-12-04 NOTE — Telephone Encounter (Signed)
Patient calls with constant lower abdominal pain and bloating for one week. Stated is worsening lately. Rates a 6 on the scale. Does not tend to be related to eating or movement.Pain and hesitation with voiding over the last few days. Watery stools 2-3 daily today is day 3. Lower back with pain also. No blood noticed in urine or stool. Mucous and foam-like substance in stool today. No fever but chills in the evenings. Occasional nausea no vomiting.Takes Dicyclomine  Daily.Attempted warm transfer with no answer. Routing to pcp for contacting patient to schedule appointment. No covid contacts/no travels.  Reason for Disposition . [1] MODERATE pain (e.g., interferes with normal activities) AND [2] pain comes and goes (cramps) AND [3] present > 24 hours  (Exception: pain with Vomiting or Diarrhea - see that Guideline)  Answer Assessment - Initial Assessment Questions 1. LOCATION: "Where does it hurt?"    Lower abdominal pain in the middle, lower back pain. 2. RADIATION: "Does the pain shoot anywhere else?" (e.g., chest, back)     no 3. ONSET: "When did the pain begin?" (e.g., minutes, hours or days ago)      One week ago 4. SUDDEN: "Gradual or sudden onset?"     gradual 5. PATTERN "Does the pain come and go, or is it constant?"    - If constant: "Is it getting better, staying the same, or worsening?"      (Note: Constant means the pain never goes away completely; most serious pain is constant and it progresses)     - If intermittent: "How long does it last?" "Do you have pain now?"     (Note: Intermittent means the pain goes away completely between bouts)    constant 6. SEVERITY: "How bad is the pain?"  (e.g., Scale 1-10; mild, moderate, or severe)   - MILD (1-3): doesn't interfere with normal activities, abdomen soft and not tender to touch    - MODERATE (4-7): interferes with normal activities or awakens from sleep, tender to touch    - SEVERE (8-10): excruciating pain, doubled over, unable to do  any normal activities      6-8 7. RECURRENT SYMPTOM: "Have you ever had this type of abdominal pain before?" If so, ask: "When was the last time?" and "What happened that time?"      Bloated, passing gas, LBM 2-3 stools every morning, some diarrhea. 8. CAUSE: "What do you think is causing the abdominal pain?"     no 9. RELIEVING/AGGRAVATING FACTORS: "What makes it better or worse?" (e.g., movement, antacids, bowel movement)     dycylamine tabs twice daily  10. OTHER SYMPTOMS: "Has there been any vomiting, diarrhea, constipation, or urine problems?"       Diarrhea. Some nausea with drinking cold water.11. PREGNANCY: "Is there any chance you are pregnant?" "When was your last menstrual period?"       no  Protocols used: ABDOMINAL PAIN - [facility]

## 2019-12-04 NOTE — Telephone Encounter (Signed)
F/u   Virtual visit with Dr. Jenny Reichmann on  12/05/19 @ 11:20am

## 2019-12-05 ENCOUNTER — Encounter: Payer: Self-pay | Admitting: Internal Medicine

## 2019-12-05 ENCOUNTER — Ambulatory Visit (INDEPENDENT_AMBULATORY_CARE_PROVIDER_SITE_OTHER): Payer: Managed Care, Other (non HMO) | Admitting: Internal Medicine

## 2019-12-05 VITALS — Ht 63.35 in | Wt 110.0 lb

## 2019-12-05 DIAGNOSIS — A09 Infectious gastroenteritis and colitis, unspecified: Secondary | ICD-10-CM | POA: Insufficient documentation

## 2019-12-05 DIAGNOSIS — R1030 Lower abdominal pain, unspecified: Secondary | ICD-10-CM | POA: Diagnosis not present

## 2019-12-05 DIAGNOSIS — Z20822 Contact with and (suspected) exposure to covid-19: Secondary | ICD-10-CM

## 2019-12-05 DIAGNOSIS — K580 Irritable bowel syndrome with diarrhea: Secondary | ICD-10-CM

## 2019-12-05 MED ORDER — DIPHENOXYLATE-ATROPINE 2.5-0.025 MG PO TABS: 1.0000 | ORAL_TABLET | Freq: Four times a day (QID) | ORAL | 0 refills | Status: DC | PRN

## 2019-12-05 NOTE — Progress Notes (Addendum)
Patient ID: Jane Torres, female   DOB: 01-Nov-1981, 39 y.o.   MRN: [id number]  Phone Visit  Cumulative time during 7-day interval 12 min, there was not an associated office visit for this concern within a 7 day period.  Verbal consent for services obtained from patient prior to services given.  Names of all persons present for services: Cathlean Cower, MD, patient  Chief complaint: diarrhea  History, background, results pertinent:  Here with 10 days onset worse than usual lower abd pain, bloating, left rectal discomfort, lower back discomfort, nausea but no vomiting, BRBPR or melena.  Had covid testing yesterday and pending.  Denies worsening reflux, dysphagia.  Pt denies chest pain, increased sob or doe, wheezing, orthopnea, PND, increased LE swelling, palpitations, dizziness or syncope.  Pt denies new neurological symptoms such as new headache, or facial or extremity weakness or numbness   Pt denies polydipsia, polyuria Past Medical History:  Diagnosis Date  . Allergy   . Anemia   . Anxiety   . Celiac disease   . Colon polyps   . GERD (gastroesophageal reflux disease)    No results found for this or any previous visit (from the past 52 hour(s)). A/P/next steps:   1)  Diarrhea - ? IBS flare vs infecitous -  Pt has covid testing pending, but if neg, can go to [facility] tomorrow for GI panel, cbc, cmet, but I suspect overall low risk of infection, ok for small rx lomotil prn  Cathlean Cower MD

## 2019-12-08 ENCOUNTER — Encounter: Payer: Self-pay | Admitting: Internal Medicine

## 2019-12-08 NOTE — Patient Instructions (Signed)
Please take all new medication as prescribed  For lab work as recommended

## 2019-12-08 NOTE — Assessment & Plan Note (Signed)
?   Infectous, For GI panel

## 2019-12-08 NOTE — Assessment & Plan Note (Signed)
See notes

## 2019-12-09 ENCOUNTER — Encounter: Payer: Self-pay | Admitting: Internal Medicine

## 2019-12-09 ENCOUNTER — Telehealth: Payer: Self-pay

## 2019-12-09 ENCOUNTER — Other Ambulatory Visit: Payer: Managed Care, Other (non HMO)

## 2019-12-09 NOTE — Telephone Encounter (Signed)
New message    The patient is asking can expected date of lab be extended to a couple of days out.

## 2019-12-09 NOTE — Telephone Encounter (Signed)
Expiration date isn't until 2022 Patient is fine to come a few days after expected date.

## 2019-12-10 ENCOUNTER — Other Ambulatory Visit (INDEPENDENT_AMBULATORY_CARE_PROVIDER_SITE_OTHER): Payer: Managed Care, Other (non HMO)

## 2019-12-10 DIAGNOSIS — R1030 Lower abdominal pain, unspecified: Secondary | ICD-10-CM | POA: Diagnosis not present

## 2019-12-10 DIAGNOSIS — Z20822 Contact with and (suspected) exposure to covid-19: Secondary | ICD-10-CM

## 2019-12-10 DIAGNOSIS — A09 Infectious gastroenteritis and colitis, unspecified: Secondary | ICD-10-CM | POA: Diagnosis not present

## 2019-12-10 LAB — BASIC METABOLIC PANEL
BUN: 9 mg/dL (ref 6–23)
CO2: 25 mEq/L (ref 19–32)
Calcium: 9.6 mg/dL (ref 8.4–10.5)
Chloride: 105 mEq/L (ref 96–112)
Creatinine, Ser: 0.6 mg/dL (ref 0.40–1.20)
GFR: 112.89 mL/min (ref >60.00)
Glucose, Bld: 89 mg/dL (ref 70–99)
Potassium: 3.7 mEq/L (ref 3.5–5.1)
Sodium: 139 mEq/L (ref 135–145)

## 2019-12-10 LAB — CBC WITH DIFFERENTIAL/PLATELET
Basophils Absolute: 0 10*3/uL (ref 0.0–0.1)
Basophils Relative: 0.4 % (ref 0.0–3.0)
Eosinophils Absolute: 0 10*3/uL (ref 0.0–0.7)
Eosinophils Relative: 0.3 % (ref 0.0–5.0)
HCT: 36.3 % (ref 36.0–46.0)
Hemoglobin: 12.2 g/dL (ref 12.0–15.0)
Lymphocytes Relative: 17.5 % (ref 12.0–46.0)
Lymphs Abs: 1.3 10*3/uL (ref 0.7–4.0)
MCHC: 33.7 g/dL (ref 30.0–36.0)
MCV: 94 fl (ref 78.0–100.0)
Monocytes Absolute: 0.5 10*3/uL (ref 0.1–1.0)
Monocytes Relative: 6.5 % (ref 3.0–12.0)
Neutro Abs: 5.7 10*3/uL (ref 1.4–7.7)
Neutrophils Relative %: 75.3 % (ref 43.0–77.0)
Platelets: 251 10*3/uL (ref 150.0–400.0)
RBC: 3.86 Mil/uL — ABNORMAL LOW (ref 3.87–5.11)
RDW: 13.4 % (ref 11.5–15.5)
WBC: 7.6 10*3/uL (ref 4.0–10.5)

## 2019-12-10 LAB — URINALYSIS, ROUTINE W REFLEX MICROSCOPIC
Bilirubin Urine: NEGATIVE
Hgb urine dipstick: NEGATIVE
Ketones, ur: NEGATIVE
Leukocytes,Ua: NEGATIVE
Nitrite: NEGATIVE
RBC / HPF: NONE SEEN (ref 0–?)
Specific Gravity, Urine: >=1.03 — AB (ref 1.000–1.030)
Total Protein, Urine: NEGATIVE
Urine Glucose: NEGATIVE
Urobilinogen, UA: 0.2 (ref 0.0–1.0)
pH: 5.5 (ref 5.0–8.0)

## 2019-12-10 LAB — SARS-COV-2 IGG: SARS-COV-2 IgG: 0.02

## 2019-12-10 LAB — LIPASE: Lipase: 23 U/L (ref 11.0–59.0)

## 2019-12-10 LAB — HEPATIC FUNCTION PANEL
ALT: 16 U/L (ref 0–35)
AST: 17 U/L (ref 0–37)
Albumin: 4.5 g/dL (ref 3.5–5.2)
Alkaline Phosphatase: 72 U/L (ref 39–117)
Bilirubin, Direct: 0.2 mg/dL (ref 0.0–0.3)
Total Bilirubin: 0.9 mg/dL (ref 0.2–1.2)
Total Protein: 7.2 g/dL (ref 6.0–8.3)

## 2019-12-12 ENCOUNTER — Other Ambulatory Visit: Payer: Self-pay | Admitting: Internal Medicine

## 2019-12-12 LAB — GASTROINTESTINAL PATHOGEN PANEL PCR
C. difficile Tox A/B, PCR: DETECTED — AB
Campylobacter, PCR: NOT DETECTED
Cryptosporidium, PCR: NOT DETECTED
E coli (ETEC) LT/ST PCR: NOT DETECTED
E coli (STEC) stx1/stx2, PCR: NOT DETECTED
E coli 0157, PCR: NOT DETECTED
Giardia lamblia, PCR: NOT DETECTED
Norovirus, PCR: NOT DETECTED
Rotavirus A, PCR: NOT DETECTED
Salmonella, PCR: NOT DETECTED
Shigella, PCR: NOT DETECTED

## 2019-12-12 MED ORDER — VANCOMYCIN 50 MG/ML ORAL SOLUTION: 125.0000 mg | Freq: Four times a day (QID) | ORAL | 0 refills | Status: DC

## 2019-12-12 MED ORDER — VANCOMYCIN HCL 125 MG PO CAPS: 125.0000 mg | ORAL_CAPSULE | Freq: Four times a day (QID) | ORAL | 0 refills | Status: DC

## 2019-12-12 NOTE — Progress Notes (Deleted)
39 y.o. G71P1011 Married Other or two or more races Not Hispanic or Latino female here for annual exam.      No LMP recorded.          Sexually active: {yes no:314532}  The current method of family planning is {contraception:315051}.    Exercising: {yes no:314532}  {types:19826} Smoker:  {YES NO:22349}  Health Maintenance: Pap:12-24-15 WNL NEG HR HPV History of abnormal Pap:no LOV:FIEPP Colonoscopy:11-18-15 polyps, needs f/u in 5 years IRJ:JOACZ YSAY:3016 Gardasil:N/A, counseled    reports that she has never smoked. She has never used smokeless tobacco. She reports that she does not drink alcohol or use drugs.  Past Medical History:  Diagnosis Date  . Allergy   . Anemia   . Anxiety   . Celiac disease   . Colon polyps   . GERD (gastroesophageal reflux disease)     Past Surgical History:  Procedure Laterality Date  . COLONOSCOPY    . UPPER GASTROINTESTINAL ENDOSCOPY      Current Outpatient Medications  Medication Sig Dispense Refill  . dicyclomine (BENTYL) 20 MG tablet Take 1 tablet (20 mg total) by mouth every 8 (eight) hours as needed for spasms. 30 tablet 3  . diphenoxylate-atropine (LOMOTIL) 2.5-0.025 MG tablet Take 1 tablet by mouth 4 (four) times daily as needed for diarrhea or loose stools. 30 tablet 0  . Famotidine (PEPCID PO) Take by mouth.    . pantoprazole (PROTONIX) 40 MG tablet Take 1 tablet (40 mg total) by mouth daily. (Patient not taking: Reported on 12/05/2019) 30 tablet 3   No current facility-administered medications for this visit.    Family History  Problem Relation Age of Onset  . Colon cancer Neg Hx   . Esophageal cancer Neg Hx   . Rectal cancer Neg Hx   . Stomach cancer Neg Hx     Review of Systems  Exam:   There were no vitals taken for this visit.  Weight change: @WEIGHTCHANGE @ Height:      Ht Readings from Last 3 Encounters:  12/05/19 5' 3.35" (1.609 m)  10/17/19 5' 3.5" (1.613 m)  08/15/19 5' 3"  (1.6 m)    General  appearance: alert, cooperative and appears stated age Head: Normocephalic, without obvious abnormality, atraumatic Neck: no adenopathy, supple, symmetrical, trachea midline and thyroid {CHL AMB PHY EX THYROID NORM DEFAULT:[phone]::"normal to inspection and palpation"} Lungs: clear to auscultation bilaterally Cardiovascular: regular rate and rhythm Breasts: {Exam; breast:13139::"normal appearance, no masses or tenderness"} Abdomen: soft, non-tender; non distended,  no masses,  no organomegaly Extremities: extremities normal, atraumatic, no cyanosis or edema Skin: Skin color, texture, turgor normal. No rashes or lesions Lymph nodes: Cervical, supraclavicular, and axillary nodes normal. No abnormal inguinal nodes palpated Neurologic: Grossly normal   Pelvic: External genitalia:  no lesions              Urethra:  normal appearing urethra with no masses, tenderness or lesions              Bartholins and Skenes: normal                 Vagina: normal appearing vagina with normal color and discharge, no lesions              Cervix: {CHL AMB PHY EX CERVIX NORM DEFAULT:[id number]::"no lesions"}               Bimanual Exam:  Uterus:  {CHL AMB PHY EX UTERUS NORM DEFAULT:[phone]::"normal size, contour, position, consistency, mobility, non-tender"}  Adnexa: {CHL AMB PHY EX ADNEXA NO MASS [phone]::"no mass, fullness, tenderness"}               Rectovaginal: Confirms               Anus:  normal sphincter tone, no lesions  *** chaperoned for the exam.  A:  Well Woman with normal exam  P:

## 2019-12-12 NOTE — Telephone Encounter (Signed)
Spoke to pt regarding  C diff toxin + by GI panel  Likely c diff colitis as pt still with symptoms, though actually some improved in recent days  To start tx with oral vancomycin - done erx  Has f/u with GI Feb 26

## 2019-12-15 ENCOUNTER — Encounter: Payer: Self-pay | Admitting: Internal Medicine

## 2019-12-17 ENCOUNTER — Ambulatory Visit: Payer: Managed Care, Other (non HMO) | Admitting: Obstetrics and Gynecology

## 2020-01-10 ENCOUNTER — Other Ambulatory Visit: Payer: Managed Care, Other (non HMO)

## 2020-01-10 ENCOUNTER — Encounter: Payer: Self-pay | Admitting: Gastroenterology

## 2020-01-10 ENCOUNTER — Ambulatory Visit (INDEPENDENT_AMBULATORY_CARE_PROVIDER_SITE_OTHER): Payer: Managed Care, Other (non HMO) | Admitting: Gastroenterology

## 2020-01-10 VITALS — BP 110/66 | HR 88 | Temp 97.6°F | Ht 63.5 in | Wt 108.4 lb

## 2020-01-10 DIAGNOSIS — E538 Deficiency of other specified B group vitamins: Secondary | ICD-10-CM | POA: Diagnosis not present

## 2020-01-10 DIAGNOSIS — D509 Iron deficiency anemia, unspecified: Secondary | ICD-10-CM

## 2020-01-10 DIAGNOSIS — R1031 Right lower quadrant pain: Secondary | ICD-10-CM

## 2020-01-10 DIAGNOSIS — Z8619 Personal history of other infectious and parasitic diseases: Secondary | ICD-10-CM

## 2020-01-10 DIAGNOSIS — R1032 Left lower quadrant pain: Secondary | ICD-10-CM

## 2020-01-10 DIAGNOSIS — R140 Abdominal distension (gaseous): Secondary | ICD-10-CM

## 2020-01-10 DIAGNOSIS — K580 Irritable bowel syndrome with diarrhea: Secondary | ICD-10-CM

## 2020-01-10 NOTE — Progress Notes (Signed)
Jane Torres    [id number]    08-30-1981  Primary Care Physician:Crawford, Real Cons, MD  Referring Physician: Hoyt Koch, MD [address] [place],  [place] [address]   Chief complaint:  IBS, C.diff  HPI: 39 year old female with history of chronic GERD, celiac disease and chronic irritable bowel syndrome here for follow-up visit after completion of therapy for C. difficile infection  Stool GI pathogen panel positive for C. difficile December 10, 2019.  She had explosive diarrhea for couple days prior to when she submitted the stool sample, subsequently diarrhea resolved even before she started taking vancomycin.  She completed 10-day course of vancomycin. She is having 2 semiformed bowel movements daily, continues to have fecal urgency, tenesmus and sensation of incomplete evacuation. Also complains of lower abdominal bloating and discomfort.  She has anorectal discomfort associated with low back pain. Denies any vomiting, melena or blood per rectum.  She is taking probiotic Florastor daily.   Review of data July 2020 low ferritin 9.7. Hemoglobin 11.8, hematocrit 35, vitamin B12 339 and vitamin D 18 She was positive for HLA DQ 2 and negative for HLA DQ 8 Stool negative for Giardia and cryptosporidia Fecal lactoferrin negative Celiac panel negative August 2019 on gluten-free diet  Outpatient Encounter Medications as of 01/10/2020  Medication Sig  . dicyclomine (BENTYL) 20 MG tablet Take 1 tablet (20 mg total) by mouth every 8 (eight) hours as needed for spasms.  . [DISCONTINUED] diphenoxylate-atropine (LOMOTIL) 2.5-0.025 MG tablet Take 1 tablet by mouth 4 (four) times daily as needed for diarrhea or loose stools.  . [DISCONTINUED] Famotidine (PEPCID PO) Take by mouth.  . [DISCONTINUED] pantoprazole (PROTONIX) 40 MG tablet Take 1 tablet (40 mg total) by mouth daily. (Patient not taking: Reported on 12/05/2019)  . [DISCONTINUED] vancomycin  (VANCOCIN) 125 MG capsule Take 1 capsule (125 mg total) by mouth 4 (four) times daily.   No facility-administered encounter medications on file as of 01/10/2020.    Allergies as of 01/10/2020 - Review Complete 01/10/2020  Allergen Reaction Noted  . Gluten meal Nausea And Vomiting 11/05/2015    Past Medical History:  Diagnosis Date  . Allergy   . Anemia   . Anxiety   . C. difficile diarrhea   . Celiac disease   . Colon polyps   . GERD (gastroesophageal reflux disease)     Past Surgical History:  Procedure Laterality Date  . COLONOSCOPY    . UPPER GASTROINTESTINAL ENDOSCOPY      Family History  Problem Relation Age of Onset  . Colon cancer Neg Hx   . Esophageal cancer Neg Hx   . Rectal cancer Neg Hx   . Stomach cancer Neg Hx     Social History   Socioeconomic History  . Marital status: Married    Spouse name: Not on file  . Number of children: 1  . Years of education: Not on file  . Highest education level: Not on file  Occupational History  . Occupation: homemaker  Tobacco Use  . Smoking status: Never Smoker  . Smokeless tobacco: Never Used  Substance and Sexual Activity  . Alcohol use: No    Alcohol/week: 0.0 standard drinks  . Drug use: No  . Sexual activity: Yes    Partners: Male    Birth control/protection: Condom  Other Topics Concern  . Not on file  Social History Narrative  . Not on file   Social Determinants of  Health   Financial Resource Strain:   . Difficulty of Paying Living Expenses: Not on file  Food Insecurity:   . Worried About Charity fundraiser in the Last Year: Not on file  . Ran Out of Food in the Last Year: Not on file  Transportation Needs:   . Lack of Transportation (Medical): Not on file  . Lack of Transportation (Non-Medical): Not on file  Physical Activity:   . Days of Exercise per Week: Not on file  . Minutes of Exercise per Session: Not on file  Stress:   . Feeling of Stress : Not on file  Social Connections:   .  Frequency of Communication with Friends and Family: Not on file  . Frequency of Social Gatherings with Friends and Family: Not on file  . Attends Religious Services: Not on file  . Active Member of Clubs or Organizations: Not on file  . Attends Archivist Meetings: Not on file  . Marital Status: Not on file  Intimate Partner Violence:   . Fear of Current or Ex-Partner: Not on file  . Emotionally Abused: Not on file  . Physically Abused: Not on file  . Sexually Abused: Not on file      Review of systems: All other review of systems negative except as mentioned in the HPI.   Physical Exam: Vitals:   01/10/20 1040  BP: 110/66  Pulse: 88  Temp: 97.6 F (36.4 C)   Body mass index is 18.9 kg/m. Gen:      No acute distress Neuro: alert and oriented x 3 Psych: normal mood and affect  Data Reviewed:  Reviewed labs, radiology imaging, old records and pertinent past GI work up   Assessment and Plan/Recommendations:  39 year old female with history of celiac disease, chronic GERD, iron deficiency anemia, irritable bowel syndrome predominant diarrhea  Recent C. difficile infection s/p 10-day course of oral vancomycin Patient is worried that she has persistent C. Difficile Recheck GI pathogen panel. Given she no longer has significant diarrhea, advised against repeat antibiotic course as it will prevent the good bacterial flora to reestablish  IBS with abdominal bloating and cramping: Use dicyclomine 20 mg every 6-8 hours as needed  B12 and iron deficiency: Continue oral B12 and iron supplements  Return in 3 months or sooner if needed  This visit required 30 minutes of patient care (this includes precharting, chart review, review of results, face-to-face time used for counseling as well as treatment plan and follow-up. The patient was provided an opportunity to ask questions and all were answered. The patient agreed with the plan and demonstrated an understanding of  the instructions.  Damaris Hippo , MD    CC: Hoyt Koch, *

## 2020-01-10 NOTE — Patient Instructions (Addendum)
If you are age 37 or older, your body mass index should be between 23-30. Your Body mass index is 18.9 kg/m. If this is out of the aforementioned range listed, please consider follow up with your Primary Care Provider.  If you are age 86 or younger, your body mass index should be between 19-25. Your Body mass index is 18.9 kg/m. If this is out of the aformentioned range listed, please consider follow up with your Primary Care Provider.   Your provider has requested that you go to the basement level for lab work before leaving today. Press "B" on the elevator. The lab is located at the first door on the left as you exit the elevator.  Follow up in 3 months or sooner if needed.

## 2020-01-24 ENCOUNTER — Other Ambulatory Visit: Payer: Self-pay

## 2020-01-27 ENCOUNTER — Ambulatory Visit (INDEPENDENT_AMBULATORY_CARE_PROVIDER_SITE_OTHER): Payer: 59 | Admitting: Obstetrics and Gynecology

## 2020-01-27 ENCOUNTER — Encounter: Payer: Self-pay | Admitting: Obstetrics and Gynecology

## 2020-01-27 ENCOUNTER — Other Ambulatory Visit: Payer: Self-pay

## 2020-01-27 VITALS — BP 108/60 | HR 85 | Temp 98.4°F | Ht 63.5 in | Wt 107.0 lb

## 2020-01-27 DIAGNOSIS — R350 Frequency of micturition: Secondary | ICD-10-CM

## 2020-01-27 DIAGNOSIS — Z01419 Encounter for gynecological examination (general) (routine) without abnormal findings: Secondary | ICD-10-CM | POA: Diagnosis not present

## 2020-01-27 DIAGNOSIS — K588 Other irritable bowel syndrome: Secondary | ICD-10-CM | POA: Diagnosis not present

## 2020-01-27 DIAGNOSIS — R300 Dysuria: Secondary | ICD-10-CM | POA: Diagnosis not present

## 2020-01-27 DIAGNOSIS — K589 Irritable bowel syndrome without diarrhea: Secondary | ICD-10-CM | POA: Insufficient documentation

## 2020-01-27 NOTE — Progress Notes (Signed)
39 y.o. G31P1011 Married Other or two or more races Not Hispanic or Latino female here for annual exam.  Patient had C-dif in January, treated as an outpatient. Struggling with IBS.  She continues to have intermittent urinary frequency. Occasional GSI. Her bladder is more sensitive prior to her cycle, some dysuria at the end of her cycle and occasionally in between.  Dysuria improves with hydration.  Period Cycle (Days): 24 Period Duration (Days): 5 Period Pattern: Regular Menstrual Flow: Moderate, Light(frist day light days 2-3 moderate then lighter from there.) Menstrual Control: Thin pad Menstrual Control Change Freq (Hours): 4 Dysmenorrhea: (!) Moderate Dysmenorrhea Symptoms: Cramping  Sexually active, no pain.   Patient's last menstrual period was 01/06/2020.          Sexually active: Yes.    The current method of family planning is condoms sometimes.    Exercising: No.  The patient does not participate in regular exercise at present. Smoker:  no  Health Maintenance: Pap:  12-24-15 WNL NEG HR HPV History of abnormal Pap:  no Colonoscopy: 11-18-15 polyps, needs f/u in 5 years TDaP: 05/2019 per patient, given at urgent care. She needed it for her green card.  Gardasil: NA   reports that she has never smoked. She has never used smokeless tobacco. She reports that she does not drink alcohol or use drugs. Son is 9.  Past Medical History:  Diagnosis Date  . Allergy   . Anemia   . Anxiety   . C. difficile diarrhea   . Celiac disease   . Colon polyps   . GERD (gastroesophageal reflux disease)     Past Surgical History:  Procedure Laterality Date  . COLONOSCOPY    . UPPER GASTROINTESTINAL ENDOSCOPY      Current Outpatient Medications  Medication Sig Dispense Refill  . dicyclomine (BENTYL) 20 MG tablet Take 1 tablet (20 mg total) by mouth every 8 (eight) hours as needed for spasms. 30 tablet 3   No current facility-administered medications for this visit.    Family History   Problem Relation Age of Onset  . Colon cancer Neg Hx   . Esophageal cancer Neg Hx   . Rectal cancer Neg Hx   . Stomach cancer Neg Hx     Review of Systems  Constitutional: Negative.   HENT: Negative.   Eyes: Negative.   Endocrine: Negative.   Genitourinary: Negative.   Skin: Negative.   Allergic/Immunologic: Positive for food allergies.  Neurological: Positive for weakness.  Hematological: Negative.   Psychiatric/Behavioral: Negative.     Exam:   BP 108/60   Pulse 85   Temp 98.4 F (36.9 C)   Ht 5' 3.5" (1.613 m)   Wt 107 lb (48.5 kg)   LMP 01/06/2020   SpO2 99%   BMI 18.66 kg/m   Weight change: @WEIGHTCHANGE @ Height:   Height: 5' 3.5" (161.3 cm)  Ht Readings from Last 3 Encounters:  01/27/20 5' 3.5" (1.613 m)  01/10/20 5' 3.5" (1.613 m)  12/05/19 5' 3.35" (1.609 m)    General appearance: alert, cooperative and appears stated age Head: Normocephalic, without obvious abnormality, atraumatic Neck: no adenopathy, supple, symmetrical, trachea midline and thyroid normal to inspection and palpation Lungs: clear to auscultation bilaterally Cardiovascular: regular rate and rhythm Breasts: normal appearance, no masses or tenderness Abdomen: soft, non-tender; non distended,  no masses,  no organomegaly Extremities: extremities normal, atraumatic, no cyanosis or edema Skin: Skin color, texture, turgor normal. No rashes or lesions Lymph nodes: Cervical, supraclavicular, and  axillary nodes normal. No abnormal inguinal nodes palpated Neurologic: Grossly normal   Pelvic: External genitalia:  no lesions              Urethra:  normal appearing urethra with no masses, tenderness or lesions              Bartholins and Skenes: normal                 Vagina: normal appearing vagina with normal color and discharge, no lesions              Cervix: no lesions               Bimanual Exam:  Uterus:  normal size, contour, position, consistency, mobility, non-tender               Adnexa: no mass, fullness, tenderness               Rectovaginal: Confirms               Anus:  normal sphincter tone, no lesions  Gae Dry chaperoned for the exam.  A:  Well Woman with normal exam  Intermittent urinary symptoms  P:   Pap next year  Labs with primary  Discussed breast self exam  Discussed calcium and vit D intake  Send urine for ua, c&s

## 2020-01-27 NOTE — Patient Instructions (Signed)
EXERCISE AND DIET:  We recommended that you start or continue a regular exercise program for good health. Regular exercise means any activity that makes your heart beat faster and makes you sweat.  We recommend exercising at least 30 minutes per day at least 3 days a week, preferably 4 or 5.  We also recommend a diet low in fat and sugar.  Inactivity, poor dietary choices and obesity can cause diabetes, heart attack, stroke, and kidney damage, among others.    ALCOHOL AND SMOKING:  Women should limit their alcohol intake to no more than 7 drinks/beers/glasses of wine (combined, not each!) per week. Moderation of alcohol intake to this level decreases your risk of breast cancer and liver damage. And of course, no recreational drugs are part of a healthy lifestyle.  And absolutely no smoking or even second hand smoke. Most people know smoking can cause heart and lung diseases, but did you know it also contributes to weakening of your bones? Aging of your skin?  Yellowing of your teeth and nails?  CALCIUM AND VITAMIN D:  Adequate intake of calcium and Vitamin D are recommended.  The recommendations for exact amounts of these supplements seem to change often, but generally speaking 1,000 mg of calcium (between diet and supplement) and 800 units of Vitamin D per day seems prudent. Certain women may benefit from higher intake of Vitamin D.  If you are among these women, your doctor will have told you during your visit.    PAP SMEARS:  Pap smears, to check for cervical cancer or precancers,  have traditionally been done yearly, although recent scientific advances have shown that most women can have pap smears less often.  However, every woman still should have a physical exam from her gynecologist every year. It will include a breast check, inspection of the vulva and vagina to check for abnormal growths or skin changes, a visual exam of the cervix, and then an exam to evaluate the size and shape of the uterus and  ovaries.  And after 37 years of age, a rectal exam is indicated to check for rectal cancers. We will also provide age appropriate advice regarding health maintenance, like when you should have certain vaccines, screening for sexually transmitted diseases, bone density testing, colonoscopy, mammograms, etc.   MAMMOGRAMS:  All women over 64 years old should have a yearly mammogram. Many facilities now offer a "3D" mammogram, which may cost around $50 extra out of pocket. If possible,  we recommend you accept the option to have the 3D mammogram performed.  It both reduces the number of women who will be called back for extra views which then turn out to be normal, and it is better than the routine mammogram at detecting truly abnormal areas.    COLON CANCER SCREENING: Now recommend starting at age 38. At this time colonoscopy is not covered for routine screening until 50. There are take home tests that can be done between 45-49.   COLONOSCOPY:  Colonoscopy to screen for colon cancer is recommended for all women at age 22.  We know, you hate the idea of the prep.  We agree, BUT, having colon cancer and not knowing it is worse!!  Colon cancer so often starts as a polyp that can be seen and removed at colonscopy, which can quite literally save your life!  And if your first colonoscopy is normal and you have no family history of colon cancer, most women don't have to have it again for  10 years.  Once every ten years, you can do something that may end up saving your life, right?  We will be happy to help you get it scheduled when you are ready.  Be sure to check your insurance coverage so you understand how much it will cost.  It may be covered as a preventative service at no cost, but you should check your particular policy.      Breast Self-Awareness Breast self-awareness means being familiar with how your breasts look and feel. It involves checking your breasts regularly and reporting any changes to your  health care provider. Practicing breast self-awareness is important. A change in your breasts can be a sign of a serious medical problem. Being familiar with how your breasts look and feel allows you to find any problems early, when treatment is more likely to be successful. All women should practice breast self-awareness, including women who have had breast implants. How to do a breast self-exam One way to learn what is normal for your breasts and whether your breasts are changing is to do a breast self-exam. To do a breast self-exam: Look for Changes  1. Remove all the clothing above your waist. 2. Stand in front of a mirror in a room with good lighting. 3. Put your hands on your hips. 4. Push your hands firmly downward. 5. Compare your breasts in the mirror. Look for differences between them (asymmetry), such as: ? Differences in shape. ? Differences in size. ? Puckers, dips, and bumps in one breast and not the other. 6. Look at each breast for changes in your skin, such as: ? Redness. ? Scaly areas. 7. Look for changes in your nipples, such as: ? Discharge. ? Bleeding. ? Dimpling. ? Redness. ? A change in position. Feel for Changes Carefully feel your breasts for lumps and changes. It is best to do this while lying on your back on the floor and again while sitting or standing in the shower or tub with soapy water on your skin. Feel each breast in the following way:  Place the arm on the side of the breast you are examining above your head.  Feel your breast with the other hand.  Start in the nipple area and make  inch (2 cm) overlapping circles to feel your breast. Use the pads of your three middle fingers to do this. Apply light pressure, then medium pressure, then firm pressure. The light pressure will allow you to feel the tissue closest to the skin. The medium pressure will allow you to feel the tissue that is a little deeper. The firm pressure will allow you to feel the tissue  close to the ribs.  Continue the overlapping circles, moving downward over the breast until you feel your ribs below your breast.  Move one finger-width toward the center of the body. Continue to use the  inch (2 cm) overlapping circles to feel your breast as you move slowly up toward your collarbone.  Continue the up and down exam using all three pressures until you reach your armpit.  Write Down What You Find  Write down what is normal for each breast and any changes that you find. Keep a written record with breast changes or normal findings for each breast. By writing this information down, you do not need to depend only on memory for size, tenderness, or location. Write down where you are in your menstrual cycle, if you are still menstruating. If you are having trouble noticing differences  in your breasts, do not get discouraged. With time you will become more familiar with the variations in your breasts and more comfortable with the exam. How often should I examine my breasts? Examine your breasts every month. If you are breastfeeding, the best time to examine your breasts is after a feeding or after using a breast pump. If you menstruate, the best time to examine your breasts is 5-7 days after your period is over. During your period, your breasts are lumpier, and it may be more difficult to notice changes. When should I see my health care provider? See your health care provider if you notice:  A change in shape or size of your breasts or nipples.  A change in the skin of your breast or nipples, such as a reddened or scaly area.  Unusual discharge from your nipples.  A lump or thick area that was not there before.  Pain in your breasts.  Anything that concerns you.  Lactose Intolerance, Adult Lactose is a natural sugar that is found in dairy milk and dairy products such as cheese and yogurt. Lactose is digested by lactase, a protein (enzyme) in your small intestine. Some people do  not produce enough lactase to digest lactose. This is called lactose intolerance. Lactose intolerance is different from milk allergy, which is a more serious reaction to the protein in milk. What are the causes? Causes of lactose intolerance may include:  Normal aging. The ability to produce lactase may lessen with age, causing lactose intolerance over time.  Being born without the ability to make lactase.  Digestive diseases such as gastroenteritis or inflammatory bowel disease (IBD).  Surgery or injury to your small intestine.  Infection in your intestines.  Certain antibiotic medicines and cancer treatments. What are the signs or symptoms? Lactose intolerance can cause discomfort within 30 minutes to 2 hours after you eat or drink something that contains lactose. Symptoms may include:  Nausea.  Diarrhea.  Cramps or pain in the abdomen.  A full, tight, or painful feeling in the abdomen (bloating).  Gas. How is this diagnosed? This condition may be diagnosed based on:  Your symptoms and medical history.  Lactose tolerance test. This test involves drinking a lactose solution and then having blood tests to measure the amount of glucose in your blood. If your blood glucose level does not go up, it means your body is not able to digest the lactose.  Lactose breath test (hydrogen breath test). This test involves drinking a lactose solution and then exhaling into a type of bag while you digest the solution. Having a lot of hydrogen in your breath can be a sign of lactose intolerance.  Stool acidity test. This involves drinking a lactose solution and then having your stool samples tested for bacteria. Having a lot of bacteria causes stool to be considered acidic, which is a sign of lactose intolerance. How is this treated? There is no treatment to improve your body's ability to produce lactase. However, you can manage your symptoms at home by:  Limiting or avoiding dairy milk, dairy  products, and other sources of lactose.  Taking lactase tablets when you eat milk products. Lactase tablets are over-the-counter medicines that help to improve lactose digestion. You may also add lactase drops to regular milk.  Adjusting your diet, such as drinking lactose-free milk. Lactose tolerance varies from person to person. Some people may be able to eat or drink small amounts of products that contain lactose, and other people may  need to avoid all foods and drinks that contain lactose. Talk with your health care provider about what treatment is best for you. Follow these instructions at home:  Limit or avoid foods, beverages, and medicines that contain lactose, as told by your health care provider. Keep track of which foods, beverages, or medicines cause symptoms so you can decide what to avoid in the future.  Read food and medicine labels carefully. Avoid products that contain: ? Lactose. ? Milk solids. ? Casein. ? Whey.  Take over-the-counter and prescription medicines (including lactase tablets) only as told by your health care provider.  If you stop eating and drinking dairy products (eliminate dairy from your diet), make sure to get enough protein, calcium, and vitamin D from other foods. Work with your health care provider or a diet and nutrition specialist (dietitian) to make sure you get enough of those nutrients.  Choose a milk substitute that is fortified with calcium and vitamin D. ? Soy milk contains high-quality protein. ? Milks that are made from nuts or grains (such as almond milk and rice milk) contain very small amounts of protein.  Keep all follow-up visits as told by your health care provider. This is important. Contact a health care provider if:  You have no relief from your symptoms after you have eliminated milk products and other sources of lactose. Get help right away if:  You have blood in your stool.  You have severe abdomen (abdominal)  pain. Summary  Lactose is a natural sugar that is found in dairy milk and dairy products such as cheese and yogurt. Lactose is digested by lactase, which is a protein (enzyme) in the small intestine.  Some people do not produce enough lactase to digest lactose. This is called lactose intolerance.  Lactose intolerance can cause discomfort within 30 minutes to 2 hours after you eat or drink something that contains lactose.  Limit or avoid foods, beverages, and medicines that contain lactose, as told by your health care provider. This information is not intended to replace advice given to you by your health care provider. Make sure you discuss any questions you have with your health care provider. Document Revised: 10/13/2017 Document Reviewed: 06/16/2017 Elsevier Patient Education  [place].

## 2020-01-28 LAB — URINALYSIS, MICROSCOPIC ONLY
Bacteria, UA: NONE SEEN
Casts: NONE SEEN /lpf
Epithelial Cells (non renal): NONE SEEN /hpf (ref 0–10)
RBC, Urine: NONE SEEN /hpf (ref 0–2)
WBC, UA: NONE SEEN /hpf (ref 0–5)

## 2020-01-29 LAB — URINE CULTURE: Organism ID, Bacteria: NO GROWTH

## 2020-03-04 ENCOUNTER — Encounter: Payer: Self-pay | Admitting: Gastroenterology

## 2020-03-04 ENCOUNTER — Other Ambulatory Visit: Payer: 59

## 2020-03-04 ENCOUNTER — Ambulatory Visit (INDEPENDENT_AMBULATORY_CARE_PROVIDER_SITE_OTHER): Payer: 59 | Admitting: Gastroenterology

## 2020-03-04 VITALS — BP 112/66 | HR 89 | Temp 98.5°F | Ht 63.0 in | Wt 106.0 lb

## 2020-03-04 DIAGNOSIS — R109 Unspecified abdominal pain: Secondary | ICD-10-CM | POA: Diagnosis not present

## 2020-03-04 DIAGNOSIS — K641 Second degree hemorrhoids: Secondary | ICD-10-CM | POA: Diagnosis not present

## 2020-03-04 DIAGNOSIS — K582 Mixed irritable bowel syndrome: Secondary | ICD-10-CM | POA: Diagnosis not present

## 2020-03-04 DIAGNOSIS — R140 Abdominal distension (gaseous): Secondary | ICD-10-CM | POA: Diagnosis not present

## 2020-03-04 DIAGNOSIS — Z8619 Personal history of other infectious and parasitic diseases: Secondary | ICD-10-CM

## 2020-03-04 DIAGNOSIS — Z86010 Personal history of colonic polyps: Secondary | ICD-10-CM

## 2020-03-04 MED ORDER — HYDROCORTISONE ACETATE 25 MG RE SUPP: 25.0000 mg | Freq: Every day | RECTAL | 0 refills | Status: DC

## 2020-03-04 NOTE — Progress Notes (Signed)
Jane Torres    [id number]    10-02-1981  Primary Care Physician:Crawford, Real Cons, MD  Referring Physician: Hoyt Koch, MD [address] [place],  [place] [address]   Chief complaint: Abdominal bloating, cramping, hemorrhoids   HPI: 39 year old female here for follow-up visit for chronic irritable bowel syndrome. She is no longer having diarrhea but continues to have intermittent mucus and burning sensation in the rectum with urgency.  No rectal bleeding. She has intermittent abdominal bloating and cramping, somewhat better with IBgard and dicyclomine as needed  She is trying to reintroduce different foods, has successfully incorporated yogurt.  Stool GI pathogen panel positive for C. difficile December 10, 2019.  She had explosive diarrhea for couple days prior to when she submitted the stool sample, subsequently diarrhea resolved even before she started taking vancomycin.  She completed 10-day course of vancomycin.  She took probiotics Florastor for 4 to 6 weeks after completion of oral vancomycin   July 2020 low ferritin 9.7. 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 Celiac panel negative August 2019 on gluten-free diet Stool negative for Giardia and cryptosporidia Fecal lactoferrin negative  Colonoscopy June 26, 2017: 3 mm sessile serrated polyp removed from cecum.  Random colon and TI biopsies negative for active inflammation.  Internal hemorrhoids.  EGD June 26, 2017: Esophageal biopsies negative for EOE.  Otherwise normal exam.   Outpatient Encounter Medications as of 03/04/2020  Medication Sig  . Cyanocobalamin (GNP VITAMIN B-12 PO) Take 1,000 mcg by mouth daily. 2 sprays daily  . dicyclomine (BENTYL) 20 MG tablet Take 1 tablet (20 mg total) by mouth every 8 (eight) hours as needed for spasms.  . Multiple Vitamin (MULTIVITAMIN) tablet Take 1 tablet  by mouth daily.   No facility-administered encounter medications on file as of 03/04/2020.    Allergies as of 03/04/2020 - Review Complete 03/04/2020  Allergen Reaction Noted  . Gluten meal Nausea And Vomiting 11/05/2015    Past Medical History:  Diagnosis Date  . Allergy   . Anemia   . Anxiety   . C. difficile diarrhea   . Celiac disease   . Colon polyps   . GERD (gastroesophageal reflux disease)   . IBS (irritable bowel syndrome)     Past Surgical History:  Procedure Laterality Date  . COLONOSCOPY    . UPPER GASTROINTESTINAL ENDOSCOPY      Family History  Problem Relation Age of Onset  . Colon cancer Neg Hx   . Esophageal cancer Neg Hx   . Rectal cancer Neg Hx   . Stomach cancer Neg Hx     Social History   Socioeconomic History  . Marital status: Married    Spouse name: Not on file  . Number of children: 1  . Years of education: Not on file  . Highest education level: Not on file  Occupational History  . Occupation: homemaker  Tobacco Use  . Smoking status: Never Smoker  . Smokeless tobacco: Never Used  Substance and Sexual Activity  . Alcohol use: No    Alcohol/week: 0.0 standard drinks  . Drug use: No  . Sexual activity: Yes    Partners: Male    Birth control/protection: Condom  Other Topics Concern  . Not on file  Social History Narrative  . Not on file   Social Determinants of Health  Financial Resource Strain:   . Difficulty of Paying Living Expenses:   Food Insecurity:   . Worried About Charity fundraiser in the Last Year:   . Arboriculturist in the Last Year:   Transportation Needs:   . Film/video editor (Medical):   Marland Kitchen Lack of Transportation (Non-Medical):   Physical Activity:   . Days of Exercise per Week:   . Minutes of Exercise per Session:   Stress:   . Feeling of Stress :   Social Connections:   . Frequency of Communication with Friends and Family:   . Frequency of Social Gatherings with Friends and Family:   . Attends  Religious Services:   . Active Member of Clubs or Organizations:   . Attends Archivist Meetings:   Marland Kitchen Marital Status:   Intimate Partner Violence:   . Fear of Current or Ex-Partner:   . Emotionally Abused:   Marland Kitchen Physically Abused:   . Sexually Abused:       Review of systems: All other review of systems negative except as mentioned in the HPI.   Physical Exam: Vitals:   03/04/20 0839  Temp: 98.5 F (36.9 C)   Body mass index is 18.78 kg/m. Gen:      No acute distress Abd:     soft, non-tender; no palpable masses, no distension Neuro: alert and oriented x 3 Psych: normal mood and affect  Data Reviewed:  Reviewed labs, radiology imaging, old records and pertinent past GI work up   Assessment and Plan/Recommendations:  39 year old female with history of chronic irritable bowel syndrome  History of C. difficile s/p treatment with oral vancomycin  Symptomatic hemorrhoids: Use Anusol suppository per rectum for 5 to 7 days and thereafter as needed.  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 intolerance We will plan to do gluten challenge with small amount of gluten for next few weeks and recheck celiac panel  Advised patient to slowly reintroduce dairy products and different food categories as tolerated  Abdominal cramping: Use IBgard up to 3 times daily as needed Dicyclomine 10 to 20 mg every 8 hours as needed as needed for severe symptoms  History of sessile serrated polyp removed in 2018, due for surveillance colonoscopy August 2023  Return in 3 months or sooner if needed  This visit required 45 minutes of patient care (this includes precharting, chart review, review of results, face-to-face time used for counseling as well as treatment plan and follow-up. The patient was provided an opportunity to ask questions and all  were answered. The patient agreed with the plan and demonstrated an understanding of the instructions.  Damaris Hippo , MD    CC: Hoyt Koch, *

## 2020-03-04 NOTE — Patient Instructions (Addendum)
If you are age 37 or older, your body mass index should be between 23-30. Your Body mass index is 18.78 kg/m. If this is out of the aforementioned range listed, please consider follow up with your Primary Care Provider.  If you are age 73 or younger, your body mass index should be between 19-25. Your Body mass index is 18.78 kg/m. If this is out of the aformentioned range listed, please consider follow up with your Primary Care Provider.   We have sent the following medications to your pharmacy for you to pick up at your convenience: Anusol Suppositories - if not covered by your insurance plan, you can use Preparation H suppositories ( over-the-counter) Avoid using suppositories for more than 7 days at a time.  Dicyclomine 10-20 mg every 8 hours as needed.  You have been given a coupon for IBgard.  1 capsule three times daily as needed.  This is over-the-counter.  [email or web address] - please go to this website.  Follow up with me on 06/09/20 at 8:10 am.   Thank you for choosing me and [facility].  Harl Bowie, MD

## 2020-03-10 ENCOUNTER — Other Ambulatory Visit: Payer: 59

## 2020-03-10 DIAGNOSIS — K580 Irritable bowel syndrome with diarrhea: Secondary | ICD-10-CM

## 2020-03-11 ENCOUNTER — Other Ambulatory Visit: Payer: 59

## 2020-03-11 DIAGNOSIS — K580 Irritable bowel syndrome with diarrhea: Secondary | ICD-10-CM

## 2020-03-14 LAB — GI PROFILE, STOOL, PCR

## 2020-03-21 ENCOUNTER — Ambulatory Visit: Payer: 59 | Attending: Internal Medicine

## 2020-03-21 ENCOUNTER — Ambulatory Visit: Payer: 59

## 2020-03-21 DIAGNOSIS — Z23 Encounter for immunization: Secondary | ICD-10-CM

## 2020-03-21 NOTE — Progress Notes (Signed)
   Covid-19 Vaccination Clinic  Name:  Jane Torres    MRN: [id number] DOB: 10-30-1981  03/21/2020  [name] was observed post Covid-19 immunization for 15 minutes without incident. She was provided with Vaccine Information Sheet and instruction to access the V-Safe system.   [name] was instructed to call 911 with any severe reactions post vaccine: Marland Kitchen Difficulty breathing  . Swelling of face and throat  . A fast heartbeat  . A bad rash all over body  . Dizziness and weakness   Immunizations Administered    Name Date Dose VIS Date Route   Pfizer COVID-19 Vaccine 03/21/2020 11:39 AM 0.3 mL 01/08/2019 Intramuscular   Manufacturer: [place]   Lot: [id number]   [place]: [id number]

## 2020-04-14 ENCOUNTER — Ambulatory Visit: Payer: 59 | Attending: Internal Medicine

## 2020-04-14 DIAGNOSIS — Z23 Encounter for immunization: Secondary | ICD-10-CM

## 2020-04-14 NOTE — Progress Notes (Signed)
   Covid-19 Vaccination Clinic  Name:  Jane Torres    MRN: [id number] DOB: 07-Jan-1981  04/14/2020  [name] was observed post Covid-19 immunization [place]   Covid-19 Vaccination Clinic  Name:  Jane Torres    MRN: [id number] DOB: 12-20-1980  04/14/2020  [name] was observed post Covid-19 immunization for 15 minutes without incident. She was provided with Vaccine Information Sheet and instruction to access the V-Safe system.   [name] was instructed to call 911 with any severe reactions post vaccine: Marland Kitchen Difficulty breathing  . Swelling of face and throat  . A fast heartbeat  . A bad rash all over body  . Dizziness and weakness   Immunizations Administered    Name Date Dose VIS Date Route   Pfizer COVID-19 Vaccine 04/14/2020  4:49 PM 0.3 mL 01/08/2019 Intramuscular   Manufacturer: [place]   Lot: [id number]   [place]: [address]-7     15 minutes without incident. She was provided with Vaccine Information Sheet and instruction to access the V-Safe system.   [name] was instructed to call 911 with any severe reactions post vaccine: Marland Kitchen Difficulty breathing  . Swelling of face and throat  . A fast heartbeat  . A bad rash all over body  . Dizziness and weakness   Immunizations Administered    Name Date Dose VIS Date Route   Pfizer COVID-19 Vaccine 04/14/2020  4:49 PM 0.3 mL 01/08/2019 Intramuscular   Manufacturer: [place]   Lot: [id number]   [place]: [id number]

## 2020-06-09 ENCOUNTER — Ambulatory Visit: Payer: 59 | Admitting: Gastroenterology

## 2020-09-10 ENCOUNTER — Encounter: Payer: Self-pay | Admitting: Internal Medicine

## 2020-09-10 MED ORDER — DICYCLOMINE HCL 20 MG PO TABS
20.0000 mg | ORAL_TABLET | Freq: Three times a day (TID) | ORAL | 2 refills | Status: DC | PRN
Start: 2020-09-10 — End: 2020-09-15

## 2020-09-14 ENCOUNTER — Telehealth: Payer: Self-pay | Admitting: Gastroenterology

## 2020-09-14 NOTE — Telephone Encounter (Signed)
Appointment accepted for 09/15/20 at 9:30 am.

## 2020-09-14 NOTE — Telephone Encounter (Signed)
Pt has been dealing with lower abd and pelvic pain since the past 15 days. She would like to be seen soon.

## 2020-09-15 ENCOUNTER — Other Ambulatory Visit (INDEPENDENT_AMBULATORY_CARE_PROVIDER_SITE_OTHER): Payer: 59

## 2020-09-15 ENCOUNTER — Encounter: Payer: Self-pay | Admitting: Gastroenterology

## 2020-09-15 ENCOUNTER — Ambulatory Visit (INDEPENDENT_AMBULATORY_CARE_PROVIDER_SITE_OTHER): Payer: 59 | Admitting: Gastroenterology

## 2020-09-15 VITALS — BP 100/66 | HR 88 | Ht 63.0 in | Wt 101.0 lb

## 2020-09-15 DIAGNOSIS — R195 Other fecal abnormalities: Secondary | ICD-10-CM

## 2020-09-15 DIAGNOSIS — R1084 Generalized abdominal pain: Secondary | ICD-10-CM | POA: Diagnosis not present

## 2020-09-15 DIAGNOSIS — K588 Other irritable bowel syndrome: Secondary | ICD-10-CM | POA: Diagnosis not present

## 2020-09-15 LAB — CBC WITH DIFFERENTIAL/PLATELET
Basophils Absolute: 0 10*3/uL (ref 0.0–0.1)
Basophils Relative: 0.5 % (ref 0.0–3.0)
Eosinophils Absolute: 0 10*3/uL (ref 0.0–0.7)
Eosinophils Relative: 0.5 % (ref 0.0–5.0)
HCT: 36.8 % (ref 36.0–46.0)
Hemoglobin: 12.5 g/dL (ref 12.0–15.0)
Lymphocytes Relative: 13.2 % (ref 12.0–46.0)
Lymphs Abs: 0.8 10*3/uL (ref 0.7–4.0)
MCHC: 33.9 g/dL (ref 30.0–36.0)
MCV: 93.8 fl (ref 78.0–100.0)
Monocytes Absolute: 0.4 10*3/uL (ref 0.1–1.0)
Monocytes Relative: 6.7 % (ref 3.0–12.0)
Neutro Abs: 4.8 10*3/uL (ref 1.4–7.7)
Neutrophils Relative %: 79.1 % — ABNORMAL HIGH (ref 43.0–77.0)
Platelets: 215 10*3/uL (ref 150.0–400.0)
RBC: 3.92 Mil/uL (ref 3.87–5.11)
RDW: 13.2 % (ref 11.5–15.5)
WBC: 6.1 10*3/uL (ref 4.0–10.5)

## 2020-09-15 LAB — COMPREHENSIVE METABOLIC PANEL
ALT: 21 U/L (ref 0–35)
AST: 20 U/L (ref 0–37)
Albumin: 4.5 g/dL (ref 3.5–5.2)
Alkaline Phosphatase: 62 U/L (ref 39–117)
BUN: 8 mg/dL (ref 6–23)
CO2: 29 mEq/L (ref 19–32)
Calcium: 9.6 mg/dL (ref 8.4–10.5)
Chloride: 103 mEq/L (ref 96–112)
Creatinine, Ser: 0.57 mg/dL (ref 0.40–1.20)
GFR: 116.33 mL/min (ref >60.00)
Glucose, Bld: 90 mg/dL (ref 70–99)
Potassium: 4 mEq/L (ref 3.5–5.1)
Sodium: 140 mEq/L (ref 135–145)
Total Bilirubin: 0.8 mg/dL (ref 0.2–1.2)
Total Protein: 7.4 g/dL (ref 6.0–8.3)

## 2020-09-15 LAB — SEDIMENTATION RATE: Sed Rate: 17 mm/hr (ref 0–20)

## 2020-09-15 LAB — VITAMIN B12: Vitamin B-12: 1164 pg/mL — ABNORMAL HIGH (ref 211–911)

## 2020-09-15 LAB — FOLATE: Folate: 20 ng/mL (ref >5.9)

## 2020-09-15 LAB — IBC + FERRITIN
Ferritin: 59.9 ng/mL (ref 10.0–291.0)
Iron: 109 ug/dL (ref 42–145)
Saturation Ratios: 31.9 % (ref 20.0–50.0)
Transferrin: 244 mg/dL (ref 212.0–360.0)

## 2020-09-15 LAB — HIGH SENSITIVITY CRP: CRP, High Sensitivity: 0.38 mg/L (ref 0.000–5.000)

## 2020-09-15 MED ORDER — NORTRIPTYLINE HCL 10 MG PO CAPS: 10.0000 mg | ORAL_CAPSULE | Freq: Every day | ORAL | 3 refills | Status: DC

## 2020-09-15 MED ORDER — DICYCLOMINE HCL 10 MG PO CAPS: 10.0000 mg | ORAL_CAPSULE | Freq: Four times a day (QID) | ORAL | 3 refills | Status: DC | PRN

## 2020-09-15 NOTE — Progress Notes (Signed)
Jane Torres    [id number]    20-Mar-1981  Primary Care Physician:Crawford, Real Cons, MD  Referring Physician: Hoyt Koch, MD [address] [place],  [place] [address]   Chief complaint: Abdominal pain, change in bowel habits HPI: 39 year old very pleasant female here for follow-up visit for irritable bowel syndrome  She is having abdominal discomfort, feels it is worse.  She had a severe episode 2 weeks ago, somewhat improved with scaling back to bland diet and dicyclomine as needed but she feels the discomfort has not completely gone. She feels the discomfort is worse as she gets closer to her menstrual cycle  Continues to struggle with her diet and is not sure what she can tolerate or not  GI history: Stool GI pathogen panel positive for C. difficile December 10, 2019. She had explosive diarrhea for couple days prior to when she submitted the stool sample, subsequently diarrhea resolved even before she started taking vancomycin. She completed 10-day course of vancomycin.  She took probiotics Florastor for 4 to 6 weeks after completion of oral vancomycin  CT abdomen pelvis with contrast October 22, 2019: 3 cm benign appearing left ovarian cyst otherwise unremarkable exam  July 2020 low ferritin 9.7. 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 Celiac panel negative August 2019 on gluten-free diet Stool negative for Giardia and cryptosporidia Fecal lactoferrin negative  Colonoscopy June 26, 2017: 3 mm sessile serrated polyp removed from cecum.  Random colon and TI biopsies negative for active inflammation.  Internal hemorrhoids.  EGD June 26, 2017: Esophageal biopsies negative for EOE.  Otherwise normal exam.   Outpatient Encounter Medications as of 09/15/2020  Medication Sig  . Cyanocobalamin (GNP VITAMIN B-12 PO) Take 1,000 mcg by mouth daily. 2 sprays  daily  . dicyclomine (BENTYL) 20 MG tablet Take 1 tablet (20 mg total) by mouth every 8 (eight) hours as needed for spasms.  . Multiple Vitamin (MULTIVITAMIN) tablet Take 1 tablet by mouth daily.  . [DISCONTINUED] hydrocortisone (ANUSOL-HC) 25 MG suppository Place 1 suppository (25 mg total) rectally at bedtime. Use for 5-7 days as needed.   No facility-administered encounter medications on file as of 09/15/2020.    Allergies as of 09/15/2020 - Review Complete 09/15/2020  Allergen Reaction Noted  . Gluten meal Nausea And Vomiting 11/05/2015    Past Medical History:  Diagnosis Date  . Allergy   . Anemia   . Anxiety   . C. difficile diarrhea   . Celiac disease   . Colon polyps   . GERD (gastroesophageal reflux disease)   . IBS (irritable bowel syndrome)     Past Surgical History:  Procedure Laterality Date  . COLONOSCOPY    . UPPER GASTROINTESTINAL ENDOSCOPY      Family History  Problem Relation Age of Onset  . Colon cancer Neg Hx   . Esophageal cancer Neg Hx   . Rectal cancer Neg Hx   . Stomach cancer Neg Hx     Social History   Socioeconomic History  . Marital status: Married    Spouse name: Not on file  . Number of children: 1  . Years of education: Not on file  . Highest education level: Not on file  Occupational History  . Occupation: homemaker  Tobacco Use  . Smoking status: Never Smoker  . Smokeless tobacco: Never Used  Vaping Use  . Vaping Use: Never used  Substance and Sexual Activity  . Alcohol use: No    Alcohol/week: 0.0 standard drinks  . Drug use: No  . Sexual activity: Yes    Partners: Male    Birth control/protection: Condom  Other Topics Concern  . Not on file  Social History Narrative  . Not on file   Social Determinants of Health   Financial Resource Strain:   . Difficulty of Paying Living Expenses: Not on file  Food Insecurity:   . Worried About Charity fundraiser in the Last Year: Not on file  . Ran Out of Food in the Last  Year: Not on file  Transportation Needs:   . Lack of Transportation (Medical): Not on file  . Lack of Transportation (Non-Medical): Not on file  Physical Activity:   . Days of Exercise per Week: Not on file  . Minutes of Exercise per Session: Not on file  Stress:   . Feeling of Stress : Not on file  Social Connections:   . Frequency of Communication with Friends and Family: Not on file  . Frequency of Social Gatherings with Friends and Family: Not on file  . Attends Religious Services: Not on file  . Active Member of Clubs or Organizations: Not on file  . Attends Archivist Meetings: Not on file  . Marital Status: Not on file  Intimate Partner Violence:   . Fear of Current or Ex-Partner: Not on file  . Emotionally Abused: Not on file  . Physically Abused: Not on file  . Sexually Abused: Not on file      Review of systems: All other review of systems negative except as mentioned in the HPI.   Physical Exam: Vitals:   09/15/20 0941  BP: 100/66  Pulse: 88   Body mass index is 17.89 kg/m. Gen:      No acute distress HEENT:  sclera anicteric Abd:      soft, non-tender; no palpable masses, no distension Ext:    No edema Neuro: alert and oriented x 3 Psych: normal mood and affect  Data Reviewed:  Reviewed labs, radiology imaging, old records and pertinent past GI work up   Assessment and Plan/Recommendations:  39 year old very pleasant female with chronic irritable bowel syndrome  Severe abdominal cramping, bloating and change in bowel habits, patient is worried about possible missed diagnosis of IBD Check CBC, CMP, ESR, CRP and fecal calprotectin to exclude any inflammatory bowel disease Reassured patient that work-up so far including EGD, colonoscopy and CT abdomen and pelvis is negative for inflammatory bowel disease  Chronic anemia with iron and B12 deficiency.  Check iron panel with ferritin, B12 and folate  Positive HLA DQ 2, celiac panel equivocal,  she is following a gluten-free diet for possible celiac disease  Use dicyclomine 10 to 20 mg every 6-8 hours as needed for abdominal cramping  We will do a trial of low-dose nortriptyline 10 mg at bedtime daily Cannot exclude possible underlying fibromyalgia, if no improvement with nortriptyline, will refer to rheumatology for further evaluation.  History of sessile serrated polyp removed in 2018, due for surveillance colonoscopy in August 2023  Return in 3 to 4 months or sooner if needed  This visit required >40 minutes of patient care (this includes precharting, chart review, review of results, face-to-face time used for counseling as well as treatment plan and follow-up. The patient was provided an opportunity to ask questions and all were answered. The  patient agreed with the plan and demonstrated an understanding of the instructions.  Damaris Hippo , MD    CC: Hoyt Koch, *

## 2020-09-15 NOTE — Patient Instructions (Signed)
If you are age 37 or older, your body mass index should be between 23-30. Your Body mass index is 17.89 kg/m. If this is out of the aforementioned range listed, please consider follow up with your Primary Care Provider.  If you are age 71 or younger, your body mass index should be between 19-25. Your Body mass index is 17.89 kg/m. If this is out of the aformentioned range listed, please consider follow up with your Primary Care Provider.    Your provider has requested that you go to the basement level for lab work before leaving today. Press "B" on the elevator. The lab is located at the first door on the left as you exit the elevator.  We have sent refills to your pharmacy   Follow up in 4 months  Due to recent changes in healthcare laws, you may see the results of your imaging and laboratory studies on MyChart before your provider has had a chance to review them.  We understand that in some cases there may be results that are confusing or concerning to you. Not all laboratory results come back in the same time frame and the provider may be waiting for multiple results in order to interpret others.  Please give [place] 48 hours in order for your provider to thoroughly review all the results before contacting the office for clarification of your results.   I appreciate the  opportunity to care for you  Thank You   Harl Bowie , MD

## 2020-09-17 ENCOUNTER — Other Ambulatory Visit: Payer: 59

## 2020-09-17 DIAGNOSIS — R195 Other fecal abnormalities: Secondary | ICD-10-CM

## 2020-09-17 DIAGNOSIS — K588 Other irritable bowel syndrome: Secondary | ICD-10-CM

## 2020-09-17 DIAGNOSIS — R1084 Generalized abdominal pain: Secondary | ICD-10-CM

## 2020-09-20 LAB — CALPROTECTIN, FECAL: Calprotectin, Fecal: <16 ug/g (ref 0–120)

## 2020-09-21 ENCOUNTER — Telehealth: Payer: Self-pay

## 2020-09-21 LAB — GI PROFILE, STOOL, PCR

## 2020-09-21 NOTE — Telephone Encounter (Signed)
AEX 3/021 H/O IBS- followed by GI LMP: 10/30 No contraception  Spoke with pt. Pt states having lower abd cramping x 1 week before cycle start. Pt states this is the only month this has happened. Pt states having intermittent abd pain x 2-3 weeks, but believes from IBS. Pt is taking Bentyl Rx and helps resolve pain. Pt having normal regular BMs every day.  When pt having cycle, states changing pad every 3-4 hours and having small clots intermittently. Pt not taking any OTC medications to help with cramps.   Pt advised to have OV for further evaluation. Pt agreeable. Pt scheduled with Dr Talbert Nan on 11/10 at 73 am. Pt agreeable to date and time of appt.  Encounter closed

## 2020-09-21 NOTE — Telephone Encounter (Signed)
Patient is calling in regards to lower abdomen pain and pelvic pain.

## 2020-09-23 ENCOUNTER — Ambulatory Visit: Payer: 59 | Admitting: Obstetrics and Gynecology

## 2020-09-23 ENCOUNTER — Other Ambulatory Visit: Payer: Self-pay

## 2020-09-23 ENCOUNTER — Encounter: Payer: Self-pay | Admitting: Obstetrics and Gynecology

## 2020-09-23 VITALS — BP 100/60 | HR 76 | Ht 63.0 in | Wt 100.6 lb

## 2020-09-23 DIAGNOSIS — R102 Pelvic and perineal pain: Secondary | ICD-10-CM | POA: Diagnosis not present

## 2020-09-23 DIAGNOSIS — Z3169 Encounter for other general counseling and advice on procreation: Secondary | ICD-10-CM | POA: Diagnosis not present

## 2020-09-23 DIAGNOSIS — R109 Unspecified abdominal pain: Secondary | ICD-10-CM | POA: Diagnosis not present

## 2020-09-23 DIAGNOSIS — K588 Other irritable bowel syndrome: Secondary | ICD-10-CM | POA: Diagnosis not present

## 2020-09-23 MED ORDER — LEVONORGEST-ETH ESTRAD 91-DAY 0.1-0.02 & 0.01 MG PO TABS: 1.0000 | ORAL_TABLET | Freq: Every day | ORAL | 1 refills | Status: DC

## 2020-09-23 NOTE — Patient Instructions (Addendum)
Pregnancy After Age 36 Women who become pregnant after the age of 60 have a higher risk for certain problems during pregnancy. This is because older women may already have health problems before becoming pregnant. Older women who are healthy before pregnancy may still develop problems during pregnancy. These problems may affect the mother, the unborn baby (fetus), or both. What are the risks for me? If you are over age 54 and you want to become pregnant or are pregnant, you may have a higher risk of:  Not being able to get pregnant (infertility).  Going into labor early (preterm labor).  Needing surgical delivery of your baby (cesarean delivery, or C-section).  Having high blood pressure (hypertension).  Having complications during pregnancy, such as high blood pressure and other symptoms (preeclampsia).  Having diabetes during pregnancy (gestational diabetes).  Being pregnant with more than one baby.  Loss of the unborn baby before 20 weeks (miscarriage) or after 20 weeks of pregnancy (stillbirth). What are the risks for my baby? Babies born to women over the age of 12 have a higher risk for:  Being born early (prematurity).  Low birth weight, which is less than 5 lb, 8 oz (2.5 kg).  Birth defects, such as Down syndrome and cleft palate.  Health complications, including problems with growth and development. How is prenatal care different for women over age 53? All women should see their health care provider before they try to become pregnant. This is especially important for women over the age of 57. Tell your health care provider about:  Any health problems you have.  Any medicines you take.  Any family history of health problems or chromosome-related defects.  Any problems you have had with past pregnancies or deliveries. If you are over age 52 and you plan to become pregnant:  Start taking a daily multivitamin a month or more before you try to get pregnant. Your  multivitamin should contain 400 mcg (micrograms) of folic acid. If you are over age 34 and pregnant, make sure you:  Keep taking your multivitamin unless your health care provider tells you not to take it.  Keep all prenatal visits as told by your health care provider. This is important.  Have ultrasounds regularly throughout your pregnancy to check for problems.  Talk with your health care provider about other prenatal screening tests that you may need. What additional prenatal tests are needed? Screening tests show whether your baby has a higher risk for birth defects than other babies. Screening tests include:  Ultrasound tests to look for markers that indicate a risk for birth defects.  Maternal blood screening. These are blood tests that measure certain substances in your blood to determine your baby's risk for defects. Screening tests do not show whether your baby has or does not have defects. They only show your baby's risk for certain defects. If your screening tests show that risk factors are present, you may need tests to confirm the defect (diagnostic testing). These tests may include:  Chorionic villus sampling. For this procedure, a tissue sample is taken from the organ that forms in your uterus to nourish your baby (placenta). The sample is removed through your cervix or abdomen and tested.  Amniocentesis. For this procedure, a small amount of the fluid that surrounds the baby in the uterus (amniotic fluid) is removed and tested. What can I do to stay healthy during my pregnancy? Staying healthy during pregnancy can help you and your baby to have a lower  risk for problems during pregnancy, during delivery, or both. Talk with your health care provider for specific instructions about staying healthy during your pregnancy. Nutrition   At each meal, eat a variety of foods from each of the five food groups. These groups include: ? Proteins such as lean meats, poultry, fish that is  low in fat, beans, eggs, and nuts. ? Vegetables such as leafy greens, raw and cooked vegetables, and vegetable juice. ? Fruits that are fresh, frozen, or canned, or 100% fruit juice. ? Dairy products such as low-fat yogurt, cheese, and milk. ? Whole grains including rice, cereal, pasta, and bread.  Talk with your health care provider about how much food in each group is right for you.  Follow instructions from your health care provider about eating and drinking restrictions during pregnancy. ? Do not eat raw eggs, raw meat, or raw fish or seafood. ? Do not eat any fish that contains high amounts of mercury, such as swordfish or mackerel.  Drink 6-8 or more glasses of water a day. You should drink enough fluid to keep your urine pale yellow. Managing weight gain  Ask your health care provider how much weight gain is healthy during pregnancy.  Stay at a healthy weight. If needed, work with your health care provider to lose weight safely. Activity  Exercise regularly, as directed by your health care provider. Ask your health care provider what forms of exercise are safe for you. General instructions  Do not use any products that contain nicotine or tobacco, such as cigarettes and e-cigarettes. If you need help quitting, ask your health care provider.  Do not drink alcohol, use drugs, or abuse prescription medicine.  Take over-the-counter and prescription medicines only as told by your health care provider.  Do not use hot tubs, steam rooms, or saunas.  Talk with your health care provider about your risk of exposure to harmful environmental conditions. This includes exposure to chemicals, radiation, cleaning products, and cat feces. Follow advice from your health care provider about how to limit your exposure. Summary  Women who become pregnant after the age of 68 have a higher risk for complications during pregnancy.  Problems may affect the mother, the unborn baby (fetus), or  both.  All women should see their health care provider before they try to become pregnant. This is especially important for women over the age of 64.  Staying healthy during pregnancy can help both you and your baby to have a lower risk for some of the problems that can happen during pregnancy, during delivery, or both. This information is not intended to replace advice given to you by your health care provider. Make sure you discuss any questions you have with your health care provider. Document Revised: 02/22/2019 Document Reviewed: 02/20/2017 Elsevier Patient Education  2020 [employer]. Preparing for Pregnancy If you are considering becoming pregnant, make an appointment to see your regular health care provider to learn how to prepare for a safe and healthy pregnancy (preconception care). During a preconception care visit, your health care provider will:  Do a complete physical exam, including a Pap test.  Take a complete medical history.  Give you information, answer your questions, and help you resolve problems. Preconception checklist Medical history  Tell your health care provider about any current or past medical conditions. Your pregnancy or your ability to become pregnant may be affected by chronic conditions, such as diabetes, chronic hypertension, and thyroid problems.  Include your family's medical history as  well as your partner's medical history.  Tell your health care provider about any history of STIs (sexually transmitted infections).These can affect your pregnancy. In some cases, they can be passed to your baby. Discuss any concerns that you have about STIs.  If indicated, discuss the benefits of genetic testing. This testing will show whether there are any genetic conditions that may be passed from you or your partner to your baby.  Tell your health care provider about: ? Any problems you have had with conception or pregnancy. ? Any medicines you take. These  include vitamins, herbal supplements, and over-the-counter medicines. ? Your history of immunizations. Discuss any vaccinations that you may need. Diet  Ask your health care provider what to include in a healthy diet that has a balance of nutrients. This is especially important when you are pregnant or preparing to become pregnant.  Ask your health care provider to help you reach a healthy weight before pregnancy. ? If you are overweight, you may be at higher risk for certain complications, such as high blood pressure, diabetes, and preterm birth. ? If you are underweight, you are more likely to have a baby who has a low birth weight. Lifestyle, work, and home  Let your health care provider know: ? About any lifestyle habits that you have, such as alcohol use, drug use, or smoking. ? About recreational activities that may put you at risk during pregnancy, such as downhill skiing and certain exercise programs. ? Tell your health care provider about any international travel, especially any travel to places with an active [place] virus outbreak. ? About harmful substances that you may be exposed to at work or at home. These include chemicals, pesticides, radiation, or even litter boxes. ? If you do not feel safe at home. Mental health  Tell your health care provider about: ? Any history of mental health conditions, including feelings of depression, sadness, or anxiety. ? Any medicines that you take for a mental health condition. These include herbs and supplements. Home instructions to prepare for pregnancy Lifestyle   Eat a balanced diet. This includes fresh fruits and vegetables, whole grains, lean meats, low-fat dairy products, healthy fats, and foods that are high in fiber. Ask to meet with a nutritionist or registered dietitian for assistance with meal planning and goals.  Get regular exercise. Try to be active for at least 30 minutes a day on most days of the week. Ask your health care  provider which activities are safe during pregnancy.  Do not use any products that contain nicotine or tobacco, such as cigarettes and e-cigarettes. If you need help quitting, ask your health care provider.  Do not drink alcohol.  Do not take illegal drugs.  Maintain a healthy weight. Ask your health care provider what weight range is right for you. General instructions  Keep an accurate record of your menstrual periods. This makes it easier for your health care provider to determine your baby's due date.  Begin taking prenatal vitamins and folic acid supplements daily as directed by your health care provider.  Manage any chronic conditions, such as high blood pressure and diabetes, as told by your health care provider. This is important. How do I know that I am pregnant? You may be pregnant if you have been sexually active and you miss your period. Symptoms of early pregnancy include:  Mild cramping.  Very light vaginal bleeding (spotting).  Feeling unusually tired.  Nausea and vomiting (morning sickness). If you have any  of these symptoms and you suspect that you might be pregnant, you can take a home pregnancy test. These tests check for a hormone in your urine (human chorionic gonadotropin, or hCG). A woman's body begins to make this hormone during early pregnancy. These tests are very accurate. Wait until at least the first day after you miss your period to take one. If the test shows that you are pregnant (you get a positive result), call your health care provider to make an appointment for prenatal care. What should I do if I become pregnant?      Make an appointment with your health care provider as soon as you suspect you are pregnant.  Do not use any products that contain nicotine, such as cigarettes, chewing tobacco, and e-cigarettes. If you need help quitting, ask your health care provider.  Do not drink alcoholic beverages. Alcohol is related to a number of birth  defects.  Avoid toxic odors and chemicals.  You may continue to have sexual intercourse if it does not cause pain or other problems, such as vaginal bleeding. This information is not intended to replace advice given to you by your health care provider. Make sure you discuss any questions you have with your health care provider. Document Revised: 11/02/2017 Document Reviewed: 05/22/2016 Elsevier Patient Education  2020 [employer]. Oral Contraception Information Oral contraceptive pills (OCPs) are medicines taken to prevent pregnancy. OCPs are taken by mouth, and they work by:  Preventing the ovaries from releasing eggs.  Thickening mucus in the lower part of the uterus (cervix), which prevents sperm from entering the uterus.  Thinning the lining of the uterus (endometrium), which prevents a fertilized egg from attaching to the endometrium. OCPs are highly effective when taken exactly as prescribed. However, OCPs do not prevent STIs (sexually transmitted infections). Safe sex practices, such as using condoms while on an OCP, can help prevent STIs. Before starting OCPs Before you start taking OCPs, you may have a physical exam, blood test, and Pap test. However, you are not required to have a pelvic exam in order to be prescribed OCPs. Your health care provider will make sure you are a good candidate for oral contraception. OCPs are not a good option for certain women, including women who smoke and are older than 35 years, and women with a medical history of high blood pressure, deep vein thrombosis, pulmonary embolism, stroke, cardiovascular disease, or peripheral vascular disease. Discuss with your health care provider the possible side effects of the OCP you may be prescribed. When you start an OCP, be aware that it can take 2-3 months for your body to adjust to changes in hormone levels. Follow instructions from your health care provider about how to start taking your first cycle of OCPs.  Depending on when you start the pill, you may need to use a backup form of birth control, such as condoms, during the first week. Make sure you know what steps to take if you ever forget to take the pill. Types of oral contraception  The most common types of birth control pills contain the hormones estrogen and progestin (synthetic progesterone) or progestin only. The combination pill This type of pill contains estrogen and progestin hormones. Combination pills often come in packs of 21, 28, or 91 pills. For each pack, the last 7 pills may not contain hormones, which means you may stop taking the pills for 7 days. Menstrual bleeding occurs during the week that you do not take the pills or  that you take the pills with no hormones in them. The minipill This type of pill contains the progestin hormone only. It comes in packs of 28 pills. All 28 pills contain the hormone. You take the pill every day. It is very important to take the pill at the same time each day. Advantages of oral contraceptive pills  Provides reliable and continuous contraception if taken as instructed.  May treat or decrease symptoms of: ? Menstrual period cramps. ? Irregular menstrual cycle or bleeding. ? Heavy menstrual flow. ? Abnormal uterine bleeding. ? Acne, depending on the type of pill. ? Polycystic ovarian syndrome. ? Endometriosis. ? Iron deficiency anemia. ? Premenstrual symptoms, including premenstrual dysphoric disorder.  May reduce the risk of endometrial and ovarian cancer.  Can be used as emergency contraception.  Prevents mislocated (ectopic) pregnancies and infections of the fallopian tubes. Things that can make oral contraceptive pills less effective OCPs can be less effective if:  You forget to take the pill at the same time every day. This is especially important when taking the minipill.  You have a stomach or intestinal disease that reduces your body's ability to absorb the pill.  You take  OCPs with other medicines that make OCPs less effective, such as antibiotics, certain HIV medicines, and some seizure medicines.  You take expired OCPs.  You forget to restart the pill on day 7, if using the packs of 21 pills. Risks associated with oral contraceptive pills Oral contraceptive pills can sometimes cause side effects, such as:  Headache.  Depression.  Trouble sleeping.  Nausea and vomiting.  Breast tenderness.  Irregular bleeding or spotting during the first several months.  Bloating or fluid retention.  Increase in blood pressure. Combination pills are also associated with a small increase in the risk of:  Blood clots.  Heart attack.  Stroke. Summary  Oral contraceptive pills are medicines taken by mouth to prevent pregnancy. They are highly effective when taken exactly as prescribed.  The most common types of birth control pills contain the hormones estrogen and progestin (synthetic progesterone) or progestin only.  Before you start taking the pill, you may have a physical exam, blood test, and Pap test. Your health care provider will make sure you are a good candidate for oral contraception.  The combination pill may come in a 21-day pack, a 28-day pack, or a 91-day pack. The minipill contains the progesterone hormone only and comes in packs of 28 pills.  Oral contraceptive pills can sometimes cause side effects, such as headache, nausea, breast tenderness, or irregular bleeding. This information is not intended to replace advice given to you by your health care provider. Make sure you discuss any questions you have with your health care provider. Document Revised: 10/13/2017 Document Reviewed: 01/24/2017 Elsevier Patient Education  [place].

## 2020-09-23 NOTE — Progress Notes (Signed)
GYNECOLOGY  VISIT   HPI: 39 y.o.   Married Other or two or more races Not Hispanic or Latino  female   [phone] with Patient's last menstrual period was 09/11/2020 (approximate).   here for  Pelvic pain. She says that at the time of period she had a lot of lower back pain and lower abdominal pain as well.    Her last cycle was ~09/11/20. Her pain with this cycle was worse than normal. Started 9 days prior to the cycle, still with mild pain. Ever month with her cycle she has moderate cramps, tolerable. Her last cycle was intolerable with the pain. She is still having some discomfort. Currently having lower back pain with movement.  The pain in her lower abdomen has been intermittent the back pain more constant.  The abdominal pain is cramping, the back pain is aching and cramping. At the worst her pain was an 8/10, currently 4/10.  No pain with BM, but she has pain after her BM.  She has a h/o IBS, has one BM a day, can be hard or soft. With the increase in pain she has noticed some mucus in her stool. She has intermittent nausea, ongoing issue, no emesis. Appetite comes and goes. She is eating bland food. Her diet can effect her stomach but she has been very careful with her diet.  Infrequently sexually active, no pain, but had some abdominal pain after sex. She takes bentyl intermittently, helps her abdominal pain, maybe some improvement in back pain.   She would like to have one more baby, but not yet.    GYNECOLOGIC HISTORY: Patient's last menstrual period was 09/11/2020 (approximate). Contraception: none  Menopausal hormone therapy: none         OB History    Gravida  2   Para  1   Term  1   Preterm      AB  1   Living  1     SAB  1   TAB      Ectopic      Multiple      Live Births  1              Patient Active Problem List   Diagnosis Date Noted  . IBS (irritable bowel syndrome)   . Infectious diarrhea 12/05/2019  . Routine general medical examination at a  health care facility 05/20/2019  . Other fatigue 06/28/2018  . Irregular periods 01/02/2018  . Cold intolerance 09/01/2017  . Neck pain 09/01/2017  . GERD (gastroesophageal reflux disease) 09/11/2016  . Insomnia 09/11/2016  . Anxiety state 09/11/2016  . Excessive flatus 01/11/2016  . Irritable bowel syndrome with diarrhea 01/11/2016  . Abdominal pain, lower 01/11/2016    Past Medical History:  Diagnosis Date  . Allergy   . Anemia   . Anxiety   . C. difficile diarrhea   . Celiac disease   . Colon polyps   . GERD (gastroesophageal reflux disease)   . IBS (irritable bowel syndrome)     Past Surgical History:  Procedure Laterality Date  . COLONOSCOPY    . UPPER GASTROINTESTINAL ENDOSCOPY      Current Outpatient Medications  Medication Sig Dispense Refill  . Cyanocobalamin (GNP VITAMIN B-12 PO) Take 1,000 mcg by mouth daily. 2 sprays daily    . dicyclomine (BENTYL) 10 MG capsule Take 1 capsule (10 mg total) by mouth every 6 (six) hours as needed for spasms. 360 capsule 3  . Multiple Vitamin (MULTIVITAMIN)  tablet Take 1 tablet by mouth daily.    . nortriptyline (PAMELOR) 10 MG capsule Take 1 capsule (10 mg total) by mouth at bedtime. 90 capsule 3   No current facility-administered medications for this visit.     ALLERGIES: Gluten meal  Family History  Problem Relation Age of Onset  . Colon cancer Neg Hx   . Esophageal cancer Neg Hx   . Rectal cancer Neg Hx   . Stomach cancer Neg Hx     Social History   Socioeconomic History  . Marital status: Married    Spouse name: Not on file  . Number of children: 1  . Years of education: Not on file  . Highest education level: Not on file  Occupational History  . Occupation: homemaker  Tobacco Use  . Smoking status: Never Smoker  . Smokeless tobacco: Never Used  Vaping Use  . Vaping Use: Never used  Substance and Sexual Activity  . Alcohol use: No    Alcohol/week: 0.0 standard drinks  . Drug use: No  . Sexual  activity: Yes    Partners: Male    Birth control/protection: Condom  Other Topics Concern  . Not on file  Social History Narrative  . Not on file   Social Determinants of Health   Financial Resource Strain:   . Difficulty of Paying Living Expenses: Not on file  Food Insecurity:   . Worried About Charity fundraiser in the Last Year: Not on file  . Ran Out of Food in the Last Year: Not on file  Transportation Needs:   . Lack of Transportation (Medical): Not on file  . Lack of Transportation (Non-Medical): Not on file  Physical Activity:   . Days of Exercise per Week: Not on file  . Minutes of Exercise per Session: Not on file  Stress:   . Feeling of Stress : Not on file  Social Connections:   . Frequency of Communication with Friends and Family: Not on file  . Frequency of Social Gatherings with Friends and Family: Not on file  . Attends Religious Services: Not on file  . Active Member of Clubs or Organizations: Not on file  . Attends Archivist Meetings: Not on file  . Marital Status: Not on file  Intimate Partner Violence:   . Fear of Current or Ex-Partner: Not on file  . Emotionally Abused: Not on file  . Physically Abused: Not on file  . Sexually Abused: Not on file    Review of Systems  Gastrointestinal: Positive for abdominal pain.  Musculoskeletal: Positive for back pain.  All other systems reviewed and are negative.   PHYSICAL EXAMINATION:    Wt 100 lb 9.6 oz (45.6 kg)   LMP 09/11/2020 (Approximate)   BMI 17.82 kg/m     General appearance: alert, cooperative and appears stated age Abdomen: soft, mildly tender BLQ, no rebound, no guarding, mildly distended, no masses,  no organomegaly  Pelvic: External genitalia:  no lesions              Urethra:  normal appearing urethra with no masses, tenderness or lesions              Bartholins and Skenes: normal                 Vagina: normal appearing vagina with normal color and discharge, no lesions               Cervix: no cervical motion tenderness and  no lesions              Bimanual Exam:  Uterus:  normal size, contour, position, consistency, mobility, non-tender              Adnexa: no mass, fullness, tenderness                Chaperone was present for exam.  ASSESSMENT Abdominal/pelvic pain, think IBS related, worse around her cycle. Normal gyn exam     PLAN Will do a trial of OCP's, loseasonique. Hope to control her worsening IBS from the hormonal changes of her cycle. No contraindication to OCP's, risks reviewed. Start on the first day of her next cycle She is considering one more pregnancy, but not until she is back from [place] (leaving for 2 months in a few weeks). She will take folic acid Information on preparing for pregnancy and AMA given    Over 30 minutes in total patient care

## 2020-09-24 ENCOUNTER — Telehealth: Payer: Self-pay | Admitting: *Deleted

## 2020-09-24 NOTE — Telephone Encounter (Signed)
Jane Torres contacted the lab and this was a mistake on them, I looked back at our office note from 11/2 and this lab was not ordered by [place].  The lab said they will credit Jane Torres account and if she received a bill for the GI pathogen panel to bring the bill to the lab, I informed the patient back through My Chart

## 2020-09-24 NOTE — Telephone Encounter (Signed)
==  View-only below this line=== ----- Message ----- From: Greggory Keen, LPN Sent: 36/11/6578   2:58 PM EST To: Oda Kilts, CMA Subject: FW: Regarding GI pathogenic panel test         help ----- Message ----- From: Mauri Pole, MD Sent: 09/24/2020   2:13 PM EST To: Greggory Keen, LPN Subject: RE: Regarding GI pathogenic panel test         Yes we did not order it, not sure how lab did the test. Can you please check with lab? Thanks ----- Message ----- From: Greggory Keen, LPN Sent: 04/16/4948   9:19 AM EST To: Mauri Pole, MD Subject: FW: Regarding GI pathogenic panel test          ----- Message ----- From: Jane Torres Sent: 09/22/2020   4:38 AM EST To: [facility] Subject: Regarding GI pathogenic panel test             Hi Dr. Fabienne Bruns pathogenic panel test result is showing in my account, but as per my last visit dated Sep 15 2020 this test had not been ordered, which I rechecked in the lab ordered detail in the summary at the time of office visit , as I was  only aware about the fecal calpro.test. And thought calpro. test is being done .   Please check there may be some confusion this time. This test had been done two times On Jan 2021 and April 2021 (which had been ordered on February 2021).  [name]

## 2021-02-01 ENCOUNTER — Ambulatory Visit: Payer: Self-pay | Admitting: Obstetrics and Gynecology

## 2021-03-05 ENCOUNTER — Ambulatory Visit (INDEPENDENT_AMBULATORY_CARE_PROVIDER_SITE_OTHER): Payer: 59 | Admitting: Internal Medicine

## 2021-03-05 ENCOUNTER — Other Ambulatory Visit: Payer: Self-pay

## 2021-03-05 ENCOUNTER — Encounter: Payer: Self-pay | Admitting: Internal Medicine

## 2021-03-05 VITALS — BP 120/70 | HR 108 | Temp 98.7°F | Resp 18 | Ht 63.0 in | Wt 113.4 lb

## 2021-03-05 DIAGNOSIS — Z0000 Encounter for general adult medical examination without abnormal findings: Secondary | ICD-10-CM | POA: Diagnosis not present

## 2021-03-05 DIAGNOSIS — K580 Irritable bowel syndrome with diarrhea: Secondary | ICD-10-CM | POA: Diagnosis not present

## 2021-03-05 DIAGNOSIS — N926 Irregular menstruation, unspecified: Secondary | ICD-10-CM

## 2021-03-05 DIAGNOSIS — K219 Gastro-esophageal reflux disease without esophagitis: Secondary | ICD-10-CM | POA: Diagnosis not present

## 2021-03-05 DIAGNOSIS — R5383 Other fatigue: Secondary | ICD-10-CM | POA: Diagnosis not present

## 2021-03-05 DIAGNOSIS — R1030 Lower abdominal pain, unspecified: Secondary | ICD-10-CM

## 2021-03-05 NOTE — Assessment & Plan Note (Signed)
Is having more bleeding with cycles, did not start birth control. Checking CBC and ferritin and B12. Adjust as needed.

## 2021-03-05 NOTE — Progress Notes (Signed)
   Subjective:   Patient ID: Jane Torres, female    DOB: 03/11/1981, 40 y.o.   MRN: [id number]  HPI The patient is a 40 YO female coming in for physical with acute concerns.   HPI #2:Here for new chest tightness around times of periods, not taking birth control prescribed by ob/gyn, denies taking ppi or GERD medication, she gets fullness in the chest around cycles, also gets some more GERD during that time, she has made numerous changes to diet and those have not helped much. Ongoing GI concerns which having worsened since last visit 2020 and then improved some since that time also.   PMH, [facility], social history reviewed and updated  Review of Systems  Constitutional: Positive for unexpected weight change.  HENT: Negative.   Eyes: Negative.   Respiratory: Positive for chest tightness. Negative for cough and shortness of breath.   Cardiovascular: Negative for chest pain, palpitations and leg swelling.  Gastrointestinal: Positive for abdominal distention, abdominal pain and diarrhea. Negative for constipation, nausea and vomiting.  Musculoskeletal: Negative.   Skin: Negative.   Neurological: Negative.   Psychiatric/Behavioral: Negative.     Objective:  Physical Exam Constitutional:      Appearance: She is well-developed.  HENT:     Head: Normocephalic and atraumatic.  Cardiovascular:     Rate and Rhythm: Normal rate and regular rhythm.  Pulmonary:     Effort: Pulmonary effort is normal. No respiratory distress.     Breath sounds: Normal breath sounds. No wheezing or rales.  Abdominal:     General: Bowel sounds are normal. There is no distension.     Palpations: Abdomen is soft.     Tenderness: There is no abdominal tenderness. There is no rebound.  Musculoskeletal:     Cervical back: Normal range of motion.  Skin:    General: Skin is warm and dry.  Neurological:     Mental Status: She is alert and oriented to person, place, and time.     Coordination: Coordination  normal.     Vitals:   03/05/21 1558  BP: 120/70  Pulse: (!) 108  Resp: 18  Temp: 98.7 F (37.1 C)  TempSrc: Oral  SpO2: 99%  Weight: 113 lb 6.4 oz (51.4 kg)  Height: 5' 3"  (1.6 m)    This visit occurred during the SARS-CoV-2 public health emergency.  Safety protocols were in place, including screening questions prior to the visit, additional usage of staff PPE, and extensive cleaning of exam room while observing appropriate contact time as indicated for disinfecting solutions.   Assessment & Plan:

## 2021-03-05 NOTE — Assessment & Plan Note (Signed)
Flu shot counseled. Covid-19 up to date. Tetanus up to date. Pap smear up to date. Counseled about sun safety and mole surveillance. Counseled about the dangers of distracted driving. Given 10 year screening recommendations.

## 2021-03-05 NOTE — Assessment & Plan Note (Signed)
Checking labs for thyroid, B12, vitamin D, iron, blood counts, kidney and liver function.

## 2021-03-05 NOTE — Assessment & Plan Note (Signed)
We talked about potential for hormonal changes causing relaxation GE sphincter and causing cyclical GERD and potential for chest pressure. Checking labs to rule out iron deficiency and blood loss.

## 2021-03-05 NOTE — Assessment & Plan Note (Signed)
Uses bentyl for this with some success.

## 2021-03-05 NOTE — Patient Instructions (Signed)
Health Maintenance, Female Adopting a healthy lifestyle and getting preventive care are important in promoting health and wellness. Ask your health care provider about:  The right schedule for you to have regular tests and exams.  Things you can do on your own to prevent diseases and keep yourself healthy. What should I know about diet, weight, and exercise? Eat a healthy diet  Eat a diet that includes plenty of vegetables, fruits, low-fat dairy products, and lean protein.  Do not eat a lot of foods that are high in solid fats, added sugars, or sodium.   Maintain a healthy weight Body mass index (BMI) is used to identify weight problems. It estimates body fat based on height and weight. Your health care provider can help determine your BMI and help you achieve or maintain a healthy weight. Get regular exercise Get regular exercise. This is one of the most important things you can do for your health. Most adults should:  Exercise for at least 150 minutes each week. The exercise should increase your heart rate and make you sweat (moderate-intensity exercise).  Do strengthening exercises at least twice a week. This is in addition to the moderate-intensity exercise.  Spend less time sitting. Even light physical activity can be beneficial. Watch cholesterol and blood lipids Have your blood tested for lipids and cholesterol at 38 years of age, then have this test every 5 years. Have your cholesterol levels checked more often if:  Your lipid or cholesterol levels are high.  You are older than 38 years of age.  You are at high risk for heart disease. What should I know about cancer screening? Depending on your health history and family history, you may need to have cancer screening at various ages. This may include screening for:  Breast cancer.  Cervical cancer.  Colorectal cancer.  Skin cancer.  Lung cancer. What should I know about heart disease, diabetes, and high blood  pressure? Blood pressure and heart disease  High blood pressure causes heart disease and increases the risk of stroke. This is more likely to develop in people who have high blood pressure readings, are of African descent, or are overweight.  Have your blood pressure checked: ? Every 3-5 years if you are 18-39 years of age. ? Every year if you are 40 years old or older. Diabetes Have regular diabetes screenings. This checks your fasting blood sugar level. Have the screening done:  Once every three years after age 40 if you are at a normal weight and have a low risk for diabetes.  More often and at a younger age if you are overweight or have a high risk for diabetes. What should I know about preventing infection? Hepatitis B If you have a higher risk for hepatitis B, you should be screened for this virus. Talk with your health care provider to find out if you are at risk for hepatitis B infection. Hepatitis C Testing is recommended for:  Everyone born from 1945 through 1965.  Anyone with known risk factors for hepatitis C. Sexually transmitted infections (STIs)  Get screened for STIs, including gonorrhea and chlamydia, if: ? You are sexually active and are younger than 38 years of age. ? You are older than 38 years of age and your health care provider tells you that you are at risk for this type of infection. ? Your sexual activity has changed since you were last screened, and you are at increased risk for chlamydia or gonorrhea. Ask your health care provider   if you are at risk.  Ask your health care provider about whether you are at high risk for HIV. Your health care provider may recommend a prescription medicine to help prevent HIV infection. If you choose to take medicine to prevent HIV, you should first get tested for HIV. You should then be tested every 3 months for as long as you are taking the medicine. Pregnancy  If you are about to stop having your period (premenopausal) and  you may become pregnant, seek counseling before you get pregnant.  Take 400 to 800 micrograms (mcg) of folic acid every day if you become pregnant.  Ask for birth control (contraception) if you want to prevent pregnancy. Osteoporosis and menopause Osteoporosis is a disease in which the bones lose minerals and strength with aging. This can result in bone fractures. If you are 65 years old or older, or if you are at risk for osteoporosis and fractures, ask your health care provider if you should:  Be screened for bone loss.  Take a calcium or vitamin D supplement to lower your risk of fractures.  Be given hormone replacement therapy (HRT) to treat symptoms of menopause. Follow these instructions at home: Lifestyle  Do not use any products that contain nicotine or tobacco, such as cigarettes, e-cigarettes, and chewing tobacco. If you need help quitting, ask your health care provider.  Do not use street drugs.  Do not share needles.  Ask your health care provider for help if you need support or information about quitting drugs. Alcohol use  Do not drink alcohol if: ? Your health care provider tells you not to drink. ? You are pregnant, may be pregnant, or are planning to become pregnant.  If you drink alcohol: ? Limit how much you use to 0-1 drink a day. ? Limit intake if you are breastfeeding.  Be aware of how much alcohol is in your drink. In the [place] one drink equals one 12 oz bottle of beer (355 mL), one 5 oz glass of wine (148 mL), or one 1 oz glass of hard liquor (44 mL). General instructions  Schedule regular health, dental, and eye exams.  Stay current with your vaccines.  Tell your health care provider if: ? You often feel depressed. ? You have ever been abused or do not feel safe at home. Summary  Adopting a healthy lifestyle and getting preventive care are important in promoting health and wellness.  Follow your health care provider's instructions about healthy  diet, exercising, and getting tested or screened for diseases.  Follow your health care provider's instructions on monitoring your cholesterol and blood pressure. This information is not intended to replace advice given to you by your health care provider. Make sure you discuss any questions you have with your health care provider. Document Revised: 10/24/2018 Document Reviewed: 10/24/2018 Elsevier Patient Education  2021 Elsevier Inc.  

## 2021-03-05 NOTE — Assessment & Plan Note (Signed)
More diarrhea prone now. Has made numerous changes to diet over the years without consistent results. Avoids gluten and dairy currently.

## 2021-03-06 LAB — CBC WITH DIFFERENTIAL/PLATELET
Absolute Monocytes: 670 cells/uL (ref 200–950)
Basophils Absolute: 46 cells/uL (ref 0–200)
Basophils Relative: 0.6 %
Eosinophils Absolute: 216 cells/uL (ref 15–500)
Eosinophils Relative: 2.8 %
HCT: 36.3 % (ref 35.0–45.0)
Hemoglobin: 11.9 g/dL (ref 11.7–15.5)
Lymphs Abs: 1733 cells/uL (ref 850–3900)
MCH: 31.9 pg (ref 27.0–33.0)
MCHC: 32.8 g/dL (ref 32.0–36.0)
MCV: 97.3 fL (ref 80.0–100.0)
MPV: 11.6 fL (ref 7.5–12.5)
Monocytes Relative: 8.7 %
Neutro Abs: 5036 cells/uL (ref 1500–7800)
Neutrophils Relative %: 65.4 %
Platelets: 219 10*3/uL (ref 140–400)
RBC: 3.73 10*6/uL — ABNORMAL LOW (ref 3.80–5.10)
RDW: 12.2 % (ref 11.0–15.0)
Total Lymphocyte: 22.5 %
WBC: 7.7 10*3/uL (ref 3.8–10.8)

## 2021-03-06 LAB — COMPREHENSIVE METABOLIC PANEL
AG Ratio: 1.8 (calc) (ref 1.0–2.5)
ALT: 42 U/L — ABNORMAL HIGH (ref 6–29)
AST: 23 U/L (ref 10–30)
Albumin: 4.4 g/dL (ref 3.6–5.1)
Alkaline phosphatase (APISO): 68 U/L (ref 31–125)
BUN: 12 mg/dL (ref 7–25)
CO2: 26 mmol/L (ref 20–32)
Calcium: 9 mg/dL (ref 8.6–10.2)
Chloride: 107 mmol/L (ref 98–110)
Creat: 0.52 mg/dL (ref 0.50–1.10)
Globulin: 2.5 g/dL (calc) (ref 1.9–3.7)
Glucose, Bld: 90 mg/dL (ref 65–99)
Potassium: 4.5 mmol/L (ref 3.5–5.3)
Sodium: 139 mmol/L (ref 135–146)
Total Bilirubin: 0.7 mg/dL (ref 0.2–1.2)
Total Protein: 6.9 g/dL (ref 6.1–8.1)

## 2021-03-06 LAB — LIPID PANEL
Cholesterol: 130 mg/dL (ref <200)
HDL: 44 mg/dL — ABNORMAL LOW (ref >=50)
LDL Cholesterol (Calc): 69 mg/dL (calc)
Non-HDL Cholesterol (Calc): 86 mg/dL (calc) (ref <130)
Total CHOL/HDL Ratio: 3 (calc) (ref <5.0)
Triglycerides: 87 mg/dL (ref <150)

## 2021-03-06 LAB — T4, FREE: Free T4: 1.1 ng/dL (ref 0.8–1.8)

## 2021-03-06 LAB — TSH: TSH: 3.13 mIU/L

## 2021-03-06 LAB — FERRITIN: Ferritin: 13 ng/mL — ABNORMAL LOW (ref 16–154)

## 2021-03-09 ENCOUNTER — Encounter: Payer: Self-pay | Admitting: Internal Medicine

## 2021-04-06 NOTE — Progress Notes (Signed)
40 y.o. [id number] Married Other or two or more races Not Hispanic or Latino female here for annual exam.her last period her had more bloation and diarrhea.  She has a h/o IBS, sometimes flares with her cycle.   In November she was given OCPs to see if they help her IBS flares, she never tried it. She would like to have another baby. She isn't using contraception, but not actively trying to get pregnant. They have sex ~ 4 x a month. She is taking folic acid. It's fine if she gets pregnant. They are using w/d for contraception.  Period Cycle (Days): 28 Period Duration (Days): 3-5 Period Pattern: Regular Menstrual Flow: Heavy Menstrual Control: Maxi pad Menstrual Control Change Freq (Hours): 2 Dysmenorrhea: (!) Moderate Dysmenorrhea Symptoms: Headache,Diarrhea,Nausea,Cramping  She feels her cycle has gotten heavier in the last 4-5 months. Not anemic on recent blood work, but low ferritin. She doesn't tolerate oral iron  Patient's last menstrual period was 04/04/2021.          Sexually active: Yes.    The current method of family planning is none.    Exercising: Yes.    yoga Smoker:  no  Health Maintenance: Pap:  12-24-15 WNL NEG HR HPV History of abnormal Pap:  no MMG:  NA BMD:   NA Colonoscopy: 11-18-15 polyps, needs f/u in 5 years TDaP:  05/2019 per patient  Gardasil: NA   reports that she has never smoked. She has never used smokeless tobacco. She reports that she does not drink alcohol and does not use drugs. They are moving to [place] next month for her husbands work. Son is 10.   Past Medical History:  Diagnosis Date  . Allergy   . Anemia   . Anxiety   . C. difficile diarrhea   . Celiac disease   . Colon polyps   . GERD (gastroesophageal reflux disease)   . IBS (irritable bowel syndrome)     Past Surgical History:  Procedure Laterality Date  . COLONOSCOPY    . UPPER GASTROINTESTINAL ENDOSCOPY      Current Outpatient Medications  Medication Sig Dispense Refill  . Multiple  Vitamin (MULTIVITAMIN) tablet Take 1 tablet by mouth daily.     No current facility-administered medications for this visit.    Family History  Problem Relation Age of Onset  . Colon cancer Neg Hx   . Esophageal cancer Neg Hx   . Rectal cancer Neg Hx   . Stomach cancer Neg Hx     Review of Systems  All other systems reviewed and are negative.   Exam:   BP 120/64   Pulse 85   Ht 5' 3.5" (1.613 m)   Wt 115 lb 12.8 oz (52.5 kg)   LMP 04/04/2021   SpO2 100%   BMI 20.19 kg/m   Weight change: @WEIGHTCHANGE @ Height:   Height: 5' 3.5" (161.3 cm)  Ht Readings from Last 3 Encounters:  04/08/21 5' 3.5" (1.613 m)  03/05/21 5' 3"  (1.6 m)  09/23/20 5' 3"  (1.6 m)    General appearance: alert, cooperative and appears stated age Head: Normocephalic, without obvious abnormality, atraumatic Neck: no adenopathy, supple, symmetrical, trachea midline and thyroid normal to inspection and palpation Lungs: clear to auscultation bilaterally Cardiovascular: regular rate and rhythm Breasts: normal appearance, no masses or tenderness Abdomen: soft, non-tender; non distended,  no masses,  no organomegaly Extremities: extremities normal, atraumatic, no cyanosis or edema Skin: Skin color, texture, turgor normal. No rashes or lesions Lymph nodes: Cervical, supraclavicular,  and axillary nodes normal. No abnormal inguinal nodes palpated Neurologic: Grossly normal   Pelvic: External genitalia:  no lesions              Urethra:  normal appearing urethra with no masses, tenderness or lesions              Bartholins and Skenes: normal                 Vagina: normal appearing vagina with normal color and discharge, no lesions              Cervix: no lesions and +ectropion               Bimanual Exam:  Uterus:  normal size, contour, position, consistency, mobility, non-tender and anteverted              Adnexa: no mass, fullness, tenderness               Rectovaginal: Confirms               Anus:   normal sphincter tone, no lesions  Gae Dry chaperoned for the exam.  1. Well woman exam Discussed breast self exam Discussed calcium and vit D intake Labs with primary She is considering pregnancy, on folic acid Given information on AMA and preparing for pregnancy  2. Screening for cervical cancer - Cytology - PAP  3. Menorrhagia with regular cycle Not anemic, but low iron stores Recent normal TSH - US PELVIS TRANSVAGINAL NON-OB (TV ONLY); Future - ibuprofen (ADVIL) 800 MG tablet; Take 1 tablet (800 mg total) by mouth every 8 (eight) hours as needed.  Dispense: 30 tablet; Refill: 1  4. Low iron stores Recommended she take an over the counter iron supplement  - US PELVIS TRANSVAGINAL NON-OB (TV ONLY); Future

## 2021-04-08 ENCOUNTER — Other Ambulatory Visit (HOSPITAL_COMMUNITY)
Admission: RE | Admit: 2021-04-08 | Discharge: 2021-04-08 | Disposition: A | Discharge status: Home / Self Care | Payer: 59 | Source: Ambulatory Visit | Attending: Obstetrics and Gynecology | Admitting: Obstetrics and Gynecology

## 2021-04-08 ENCOUNTER — Other Ambulatory Visit: Payer: Self-pay

## 2021-04-08 ENCOUNTER — Encounter: Payer: Self-pay | Admitting: Obstetrics and Gynecology

## 2021-04-08 ENCOUNTER — Ambulatory Visit (INDEPENDENT_AMBULATORY_CARE_PROVIDER_SITE_OTHER): Payer: 59 | Admitting: Obstetrics and Gynecology

## 2021-04-08 VITALS — BP 120/64 | HR 85 | Ht 63.5 in | Wt 115.8 lb

## 2021-04-08 DIAGNOSIS — N920 Excessive and frequent menstruation with regular cycle: Secondary | ICD-10-CM

## 2021-04-08 DIAGNOSIS — Z01419 Encounter for gynecological examination (general) (routine) without abnormal findings: Secondary | ICD-10-CM

## 2021-04-08 DIAGNOSIS — R790 Abnormal level of blood mineral: Secondary | ICD-10-CM

## 2021-04-08 DIAGNOSIS — Z124 Encounter for screening for malignant neoplasm of cervix: Secondary | ICD-10-CM

## 2021-04-08 MED ORDER — IBUPROFEN 800 MG PO TABS: 800.0000 mg | ORAL_TABLET | Freq: Three times a day (TID) | ORAL | 1 refills | Status: AC | PRN

## 2021-04-08 NOTE — Patient Instructions (Addendum)
You can try slow fe for iron supplementation. You can try magnesium up to 500 mg a day with iron to help prevent constipation.   EXERCISE   We recommended that you start or continue a regular exercise program for good health. Physical activity is anything that gets your body moving, some is better than none. The CDC recommends 150 minutes per week of Moderate-Intensity Aerobic Activity and 2 or more days of Muscle Strengthening Activity.  Benefits of exercise are limitless: helps weight loss/weight maintenance, improves mood and energy, helps with depression and anxiety, improves sleep, tones and strengthens muscles, improves balance, improves bone density, protects from chronic conditions such as heart disease, high blood pressure and diabetes and so much more. To learn more visit: WhyNotPoker.uy  DIET: Good nutrition starts with a healthy diet of fruits, vegetables, whole grains, and lean protein sources. Drink plenty of water for hydration. Minimize empty calories, sodium, sweets. For more information about dietary recommendations visit: GeekRegister.com.ee and [email or web address]-[email or web address]  ALCOHOL:  Women should limit their alcohol intake to no more than 7 drinks/beers/glasses of wine (combined, not each!) per week. Moderation of alcohol intake to this level decreases your risk of breast cancer and liver damage.  If you are concerned that you may have a problem, or your friends have told you they are concerned about your drinking, there are many resources to help. A well-known program that is free, effective, and available to all people all over the nation is Alcoholics Anonymous.  Check out this site to learn more: BlockTaxes.se   CALCIUM AND VITAMIN D:  Adequate intake of calcium and Vitamin D are recommended for bone health.  You should be getting between 1000-1200 mg of calcium and 800 units of Vitamin D  daily between diet and supplements  PAP SMEARS:  Pap smears, to check for cervical cancer or precancers,  have traditionally been done yearly, scientific advances have shown that most women can have pap smears less often.  However, every woman still should have a physical exam from her gynecologist every year. It will include a breast check, inspection of the vulva and vagina to check for abnormal growths or skin changes, a visual exam of the cervix, and then an exam to evaluate the size and shape of the uterus and ovaries. We will also provide age appropriate advice regarding health maintenance, like when you should have certain vaccines, screening for sexually transmitted diseases, bone density testing, colonoscopy, mammograms, etc.   MAMMOGRAMS:  All women over 39 years old should have a routine mammogram.   COLON CANCER SCREENING: Now recommend starting at age 22. At this time colonoscopy is not covered for routine screening until 50. There are take home tests that can be done between 45-49.   COLONOSCOPY:  Colonoscopy to screen for colon cancer is recommended for all women at age 14.  We know, you hate the idea of the prep.  We agree, BUT, having colon cancer and not knowing it is worse!!  Colon cancer so often starts as a polyp that can be seen and removed at colonscopy, which can quite literally save your life!  And if your first colonoscopy is normal and you have no family history of colon cancer, most women don't have to have it again for 10 years.  Once every ten years, you can do something that may end up saving your life, right?  We will be happy to help you get it scheduled when you are ready.  Be sure to check your  insurance coverage so you understand how much it will cost.  It may be covered as a preventative service at no cost, but you should check your particular policy.      Breast Self-Awareness Breast self-awareness means being familiar with how your breasts look and feel. It  involves checking your breasts regularly and reporting any changes to your health care provider. Practicing breast self-awareness is important. A change in your breasts can be a sign of a serious medical problem. Being familiar with how your breasts look and feel allows you to find any problems early, when treatment is more likely to be successful. All women should practice breast self-awareness, including women who have had breast implants. How to do a breast self-exam One way to learn what is normal for your breasts and whether your breasts are changing is to do a breast self-exam. To do a breast self-exam: Look for Changes  1. Remove all the clothing above your waist. 2. Stand in front of a mirror in a room with good lighting. 3. Put your hands on your hips. 4. Push your hands firmly downward. 5. Compare your breasts in the mirror. Look for differences between them (asymmetry), such as: ? Differences in shape. ? Differences in size. ? Puckers, dips, and bumps in one breast and not the other. 6. Look at each breast for changes in your skin, such as: ? Redness. ? Scaly areas. 7. Look for changes in your nipples, such as: ? Discharge. ? Bleeding. ? Dimpling. ? Redness. ? A change in position. Feel for Changes Carefully feel your breasts for lumps and changes. It is best to do this while lying on your back on the floor and again while sitting or standing in the shower or tub with soapy water on your skin. Feel each breast in the following way:  Place the arm on the side of the breast you are examining above your head.  Feel your breast with the other hand.  Start in the nipple area and make  inch (2 cm) overlapping circles to feel your breast. Use the pads of your three middle fingers to do this. Apply light pressure, then medium pressure, then firm pressure. The light pressure will allow you to feel the tissue closest to the skin. The medium pressure will allow you to feel the tissue  that is a little deeper. The firm pressure will allow you to feel the tissue close to the ribs.  Continue the overlapping circles, moving downward over the breast until you feel your ribs below your breast.  Move one finger-width toward the center of the body. Continue to use the  inch (2 cm) overlapping circles to feel your breast as you move slowly up toward your collarbone.  Continue the up and down exam using all three pressures until you reach your armpit.  Write Down What You Find  Write down what is normal for each breast and any changes that you find. Keep a written record with breast changes or normal findings for each breast. By writing this information down, you do not need to depend only on memory for size, tenderness, or location. Write down where you are in your menstrual cycle, if you are still menstruating. If you are having trouble noticing differences in your breasts, do not get discouraged. With time you will become more familiar with the variations in your breasts and more comfortable with the exam. How often should I examine my breasts? Examine your breasts every month. If you  are breastfeeding, the best time to examine your breasts is after a feeding or after using a breast pump. If you menstruate, the best time to examine your breasts is 5-7 days after your period is over. During your period, your breasts are lumpier, and it may be more difficult to notice changes. When should I see my health care provider? See your health care provider if you notice:  A change in shape or size of your breasts or nipples.  A change in the skin of your breast or nipples, such as a reddened or scaly area.  Unusual discharge from your nipples.  A lump or thick area that was not there before.  Pain in your breasts.  Anything that concerns you.   Pregnancy After Age 51 Women who become pregnant after the age of 107 have a higher risk for certain problems during pregnancy. This is  because older women may already have health problems before becoming pregnant. Older women who are healthy before pregnancy may still develop problems during pregnancy. These problems may affect the mother, the unborn baby (fetus), or both. How does this affect me? If you are over age 52 and you want to become pregnant or are pregnant, you may have a higher risk of:  Not being able to get pregnant (infertility).  Going into labor early (preterm labor).  Needing surgical delivery of your baby (cesarean delivery, or C-section).  Having complications during pregnancy, such as high blood pressure and other symptoms (preeclampsia).  Having diabetes during pregnancy (gestational diabetes).  Being pregnant with more than one baby.  Loss of the unborn baby before 20 weeks (miscarriage) or after 20 weeks of pregnancy (stillbirth). How does this affect my baby? Babies born to women over the age of 26 have a higher risk for:  Being born early (prematurity).  Low birth weight, which is less than 5 lb, 8 oz (2.5 kg).  Birth defects, such as Down syndrome and cleft palate.  Health complications, including problems with growth and development. Prenatal care All women should see their health care provider before they try to become pregnant. This is especially important for women over the age of 76. Tell your health care provider about:  Any health problems you have.  Any medicines you take.  Any family history of health problems or chromosome-related defects.  Any problems you have had with past surgeries, pregnancies, or deliveries. If you are over age 50 and you plan to become pregnant, start taking a daily multivitamin a month or more before you try to get pregnant. Your multivitamin should contain 400 mcg (micrograms) of folic acid. If you are over age 55 and pregnant, make sure you:  Keep taking your multivitamin unless your health care provider tells you not to take it.  Keep all  follow-up visits. This includes prenatal visits. This is important.  Talk with your health care provider about other prenatal screening tests that you may need. Prenatal tests Screening tests show whether your baby has a higher risk for birth defects than other babies. Screening tests include:  More frequent ultrasound tests to look for markers that indicate a risk for birth defects.  Maternal blood screening. These are blood tests that help to determine your baby's risk for birth defects. Screening tests do not show whether your baby has or does not have birth defects. They only show your baby's risk for certain birth defects. If your screening tests show that risk factors are present, you may need tests to  confirm the birth defect (diagnostic testing). These tests may include:  Chorionic villus sampling. For this procedure, a tissue sample is taken from the organ that forms in your uterus to nourish your baby (placenta). The sample is removed through your cervix or abdomen and tested.  Amniocentesis. For this procedure, a small amount of the fluid that surrounds the baby in the uterus (amniotic fluid) is removed and tested. You will also need screenings tests for gestational diabetes in the first trimester, as well as later in pregnancy. Staying healthy during pregnancy Staying healthy during pregnancy can help you and your baby to have a lower risk for problems during pregnancy, during delivery, or both. Talk with your health care provider for specific instructions about staying healthy during your pregnancy. General tips Nutrition  At each meal, eat a variety of foods from each of the five food groups. These groups include: ? Proteins such as lean meats, poultry, fish that is low in fat, beans, eggs, and nuts. ? Vegetables such as leafy greens, raw and cooked vegetables, and vegetable juice. ? Fruits that are fresh, frozen, or canned, or 100% fruit juice. ? Dairy products such as low-fat  yogurt, cheese, and milk. ? Whole grains including rice, cereal, pasta, and bread.  Follow instructions from your health care provider about eating and drinking restrictions during pregnancy. ? Do not eat raw eggs, raw meat, or raw fish or seafood. ? Do not eat any fish that contains high amounts of mercury, such as swordfish or mackerel.   Managing weight gain  Ask your health care provider how much weight gain is healthy during pregnancy.  Stay at a healthy weight. If needed, work with your health care provider to lose weight safely.  Exercise regularly, as directed by your health care provider. Ask your health care provider what forms of exercise are safe for you. Follow these instructions at home:  Do not use any products that contain nicotine or tobacco. These products include cigarettes, chewing tobacco, and vaping devices, such as e-cigarettes. If you need help quitting, ask your health care provider.  Drink enough fluid to keep your urine pale yellow.  Do not drink alcohol, use drugs, or abuse prescription medicine.  Take over-the-counter and prescription medicines only as told by your health care provider.  Do not use hot tubs, steam rooms, or saunas.  Talk with your health care provider about your risk of exposure to harmful environmental conditions. This includes exposure to chemicals, radiation, cleaning products, and cat feces. Follow advice from your health care provider about how to limit your exposure. Summary  Women who become pregnant after the age of 65 have a higher risk for complications during pregnancy.  Problems may affect the mother, the unborn baby (fetus), or both.  All women should see their health care provider before they try to become pregnant. This is especially important for women over the age of 29.  Staying healthy during pregnancy can help both you and your baby to have a lower risk for some of the problems that can happen during pregnancy, during  delivery, or both. This information is not intended to replace advice given to you by your health care provider. Make sure you discuss any questions you have with your health care provider. Document Revised: 07/20/2020 Document Reviewed: 07/20/2020 Elsevier Patient Education  2021 [place].  Preparing for Pregnancy If you are planning to become pregnant, talk to your health care provider about preconception care. This type of care helps you  prepare for a safe and healthy pregnancy. During this visit, your health care provider will:  Do a complete physical exam, including a Pap test.  Take your complete medical history.  Give you information, answer your questions, and help you resolve problems. Preconception checklist Medical history  Tell your health care provider about any medical conditions you have or have had. Your pregnancy or your ability to become pregnant may be affected by long-term (chronic) conditions, such as: ? Diabetes. ? High blood pressure (hypertension). ? Thyroid problems.  Tell your health care provider about your family's medical history and your partner's medical history.  Tell your health care provider if you have or have had any sexually transmitted infections, orSTIs. These can affect your pregnancy. In some cases, they can be passed to your baby.  If needed, discuss the benefits of genetic testing. This test checks for conditions that may be passed from parent to child.  Tell your health care provider about: ? Any problems you had getting pregnant or while pregnant. ? Any medicines you take. These include vitamins, herbal supplements, and over-the-counter medicines. ? Your history of getting vaccines. Discuss any vaccines that you may need. Diet  Ask your health care provider about what foods to eat in order to get a balance of nutrients. This is especially important when you are pregnant or preparing to become pregnant. It is recommended that women of  childbearing age take a folic acid supplement of 400 mcg daily and eat foods rich in folic acid to prevent certain birth defects.  Ask your health care provider to help you reach a healthy weight before pregnancy. ? If you are overweight, you may have a higher risk for certain problems. These include hypertension, diabetes, and early (preterm) birth. ? If you are underweight, you are more likely to have a baby who has a low birth weight. Lifestyle, work, and home Let your health care provider know about:  Any lifestyle habits that you have, such as use of alcohol, drugs, or tobacco products.  Fun and leisure activities that may put you at risk during pregnancy, such as downhill skiing and certain exercise programs.  Any plans to travel out of the country, especially to places with an active [place] virus outbreak.  Harmful substances that you may be exposed to at work or at home. These include chemicals, pesticides, radiation, and substances from cat litter boxes.  Any concerns you have for your safety at home. Mental health Tell your health care provider about:  Any history of mental health conditions, including feelings of depression, sadness, or anxiety.  Any medicines that you take for a mental health condition. These include herbs and supplements. How do I know that I am pregnant? You may be pregnant if you have been sexually active and you miss your period. Other symptoms of early pregnancy include:  Mild cramping.  Very light vaginal bleeding (spotting).  Feeling more tired than usual.  Nausea and vomiting. These may be signs of morning sickness. Take a home pregnancy test if you have any of these symptoms. This test checks for a hormone in your urine called human chorionic gonadotropin, or hCG. A woman's body begins to make this hormone during early pregnancy. These tests are very accurate. Wait until at least the first day after you miss your period to take a home pregnancy  test. If the test shows that you are pregnant, call your health care provider for a prenatal care visit. What should I do if  I become pregnant?  Schedule a visit with your health care provider as soon as you suspect you are pregnant.  Talk to your health care provider if you are taking prescription medicines to determine if they are safe to take during pregnancy.  You may continue to have sex if it does not cause pain or other problems, such as vaginal bleeding. Follow these instructions at home: Eating and drinking  Follow instructions from your health care provider about eating or drinking restrictions.  Drink enough fluid to keep your urine pale yellow.  Eat a balanced diet. This includes fresh fruits and vegetables, whole grains, lean meats, low-fat dairy products, healthy fats, and foods that are high in fiber. Ask to meet with a nutritionist or registered dietitian for help with meal planning and goals.  Avoid eating raw or undercooked meat and seafood.  Avoid eating or drinking unpasteurized dairy products.   Lifestyle  Get regular exercise. Try to be active for at least 30 minutes a day on most days of the week. Ask your health care provider which activities are safe during pregnancy.  Maintain a healthy weight.  Avoid toxic fumes and chemicals.  Avoid cleaning cat litter boxes. Cat feces may contain a harmful parasite called toxoplasma.  Avoid travel to countries where [place] virus is common.  Do not use any products that contain nicotine or tobacco, such as cigarettes, e-cigarettes, and chewing tobacco. If you need help quitting, ask your health care provider.  Do not drink alcohol or use drugs.      General instructions  Keep an accurate record of your menstrual periods. This makes it easier for your health care provider to determine your baby's due date.  Take over-the-counter and prescription medicines only as told by your health care provider.  Begin taking  prenatal vitamins and folic acid supplements daily as directed.  Manage any chronic conditions, such as hypertension and diabetes, as told by your health care provider. This is important. Summary  If you are planning to become pregnant, talk to your health care provider about preconception care. This is an important part of planning for a healthy pregnancy.  Women of childbearing age should take 048 mcg of folic acid daily in addition to eating a diet rich in folic acid. This will prevent certain birth defects.  Schedule a visit with your health care provider as soon as you suspect you are pregnant. Tell your health care provider about your medical history, lifestyle activities, home safety, and other things that may concern you. This information is not intended to replace advice given to you by your health care provider. Make sure you discuss any questions you have with your health care provider. Document Revised: 07/31/2019 Document Reviewed: 07/31/2019 Elsevier Patient Education  [place].

## 2021-04-13 LAB — CYTOLOGY - PAP
Comment: NEGATIVE
Diagnosis: NEGATIVE
Diagnosis: REACTIVE
High risk HPV: NEGATIVE

## 2021-04-20 ENCOUNTER — Other Ambulatory Visit: Payer: Self-pay

## 2021-04-20 ENCOUNTER — Encounter: Payer: Self-pay | Admitting: Obstetrics and Gynecology

## 2021-04-20 ENCOUNTER — Ambulatory Visit: Payer: 59 | Admitting: Obstetrics and Gynecology

## 2021-04-20 ENCOUNTER — Ambulatory Visit (INDEPENDENT_AMBULATORY_CARE_PROVIDER_SITE_OTHER): Payer: 59

## 2021-04-20 VITALS — BP 110/60 | HR 90 | Resp 14 | Ht 63.25 in | Wt 115.0 lb

## 2021-04-20 DIAGNOSIS — N83201 Unspecified ovarian cyst, right side: Secondary | ICD-10-CM

## 2021-04-20 DIAGNOSIS — N920 Excessive and frequent menstruation with regular cycle: Secondary | ICD-10-CM

## 2021-04-20 DIAGNOSIS — R790 Abnormal level of blood mineral: Secondary | ICD-10-CM

## 2021-04-20 DIAGNOSIS — N83202 Unspecified ovarian cyst, left side: Secondary | ICD-10-CM

## 2021-04-20 NOTE — Patient Instructions (Signed)
You may have endometriosis.   You need a follow up ultrasound in 6 months  Endometriosis  Endometriosis is a condition in which a tissue similar to the endometrium grows in places outside the uterus. The endometrium is a tissue that forms the lining of the uterus. This tissue can grow in the organs that create the eggs (ovaries), the tubes that carry the eggs to the uterus (fallopian tubes), the vagina, and the bowel. This tissue most often grows on the ovaries and inner lining of the pelvic cavity (peritoneum). When the uterus sheds the endometrium every menstrual cycle, there is bleeding wherever these types of tissue are located. This can cause pain because blood is irritating to tissues that are not normally exposed to it. Endometriosis can also make it harder for a woman to get pregnant. What are the causes? The cause of this condition is not known. What increases the risk? The following factors may make you more likely to develop this condition:  Having a family history of endometriosis.  Having never given birth.  Starting your period at 40 years of age or younger. What are the signs or symptoms? Often, there are no symptoms of this condition. If you do have symptoms, they may:  Vary depending on where the abnormal tissue is growing.  Occur during your menstrual period (most often) or at the middle of your cycle.  Come and go. You may have no symptoms during some months.  Stop when you no longer have your monthly periods (menopause). Symptoms may include:  Pain in the area between your hip bones (pelvis).  Heavier bleeding during periods.  Menstrual periods that happen more than once a month.  Pain during sex.  Pain in the back or abdomen.  Painful bowel movements.  Not being able to get pregnant. How is this diagnosed? This condition is diagnosed based on your symptoms and a physical exam. You may have tests, such as:  Blood tests and urine tests to help rule out  other causes.  Ultrasound to look for tissues that are not normal. This is often done over your skin. It is sometimes done through the vagina (transvaginal).  X-ray of the lower bowel (barium enema).  CT scan.  MRI. To confirm the diagnosis, your health care provider may use a device with a small camera to check tissue inside your abdomen (laparoscopy). Abnormal tissue may be removed and checked in a lab (biopsy). How is this treated? There is no cure for this condition. Treatment focuses on controlling your symptoms. The type of treatment also depends on whether you want to become pregnant in the future. This condition may be treated with:  Medicines. These may include: ? Medicines to relieve pain, including NSAIDs such as ibuprofen. ? Hormone therapy. This uses artificial hormones to slow the growth of the abnormal tissue. This may include hormonal birth control, such as pills.  Surgery to remove the abnormal tissue. During surgery: ? Tissue may be removed using a laparoscope and a laser (laparoscopic laser treatment). ? The fallopian tubes, uterus, and ovaries may be removed (hysterectomy). This is done in very severe cases. Follow these instructions at home:  Get regular exercise.  Limit alcohol use.  Eat a balanced diet.  Avoid caffeine.  Take over-the-counter and prescription medicines only as told by your health care provider.  Keep all follow-up visits as told by your health care provider. This is important. Where to find more information  [employer]: EgoNews.co.uk  Office on [employer] [facility]: ChromeDoors.com.ee Contact a health care provider if:  You are having new pain or trouble controlling pain.  You have problems getting pregnant.  You have a fever. Get help right away if you have:  Severe pain that does not get better with medicine.  Severe nausea and vomiting, or if you cannot eat or drink  without vomiting.  Pain that affects your abdomen only on the lower, right side.  Pain in your abdomen that gets worse.  Swelling in your abdomen.  Blood in your stool (feces). Summary  Endometriosis is a condition in which a tissue similar to the endometrium grows in places outside the uterus. The endometrium is a tissue that forms the lining of the uterus.  The cause of this condition is not known.  This condition may be treated with medicines to relieve pain, hormone therapy, or surgery.  If you have this condition, get regular exercise, limit alcohol use, and avoid caffeine.  Get help right away if you have severe pain that does not get better with medicine, or if you have severe nausea and vomiting or blood in your stool. This information is not intended to replace advice given to you by your health care provider. Make sure you discuss any questions you have with your health care provider. Document Revised: 12/18/2019 Document Reviewed: 12/18/2019 Elsevier Patient Education  [place].

## 2021-04-20 NOTE — Progress Notes (Signed)
GYNECOLOGY  VISIT   HPI: 40 y.o.   Married Other or two or more races Not Hispanic or Latino  female   [id number] with Patient's last menstrual period was 04/04/2021.   here for evaluation of menorrhagia. She is not anemic, but has low iron stores (ferritin in 4/22 was 13). Normal TSH. She has moderate cramps, no change.   She is considering pregnancy.   GYNECOLOGIC HISTORY: Patient's last menstrual period was 04/04/2021. Contraception: withdrawl Menopausal hormone therapy: NA        OB History    Gravida  2   Para  1   Term  1   Preterm      AB  1   Living  1     SAB  1   IAB      Ectopic      Multiple      Live Births  1              Patient Active Problem List   Diagnosis Date Noted  . IBS (irritable bowel syndrome)   . Routine general medical examination at a health care facility 05/20/2019  . Other fatigue 06/28/2018  . Irregular periods 01/02/2018  . Neck pain 09/01/2017  . GERD (gastroesophageal reflux disease) 09/11/2016  . Insomnia 09/11/2016  . Anxiety state 09/11/2016  . Excessive flatus 01/11/2016  . Irritable bowel syndrome with diarrhea 01/11/2016  . Abdominal pain, lower 01/11/2016    Past Medical History:  Diagnosis Date  . Allergy   . Anemia   . Anxiety   . C. difficile diarrhea   . Celiac disease   . Colon polyps   . GERD (gastroesophageal reflux disease)   . IBS (irritable bowel syndrome)     Past Surgical History:  Procedure Laterality Date  . COLONOSCOPY    . UPPER GASTROINTESTINAL ENDOSCOPY      Current Outpatient Medications  Medication Sig Dispense Refill  . ibuprofen (ADVIL) 800 MG tablet Take 1 tablet (800 mg total) by mouth every 8 (eight) hours as needed. 30 tablet 1  . Multiple Vitamin (MULTIVITAMIN) tablet Take 1 tablet by mouth daily.     No current facility-administered medications for this visit.     ALLERGIES: Gluten meal  Family History  Problem Relation Age of Onset  . Colon cancer Neg Hx   .  Esophageal cancer Neg Hx   . Rectal cancer Neg Hx   . Stomach cancer Neg Hx     Social History   Socioeconomic History  . Marital status: Married    Spouse name: Not on file  . Number of children: 1  . Years of education: Not on file  . Highest education level: Not on file  Occupational History  . Occupation: homemaker  Tobacco Use  . Smoking status: Never Smoker  . Smokeless tobacco: Never Used  Vaping Use  . Vaping Use: Never used  Substance and Sexual Activity  . Alcohol use: No    Alcohol/week: 0.0 standard drinks  . Drug use: No  . Sexual activity: Yes    Partners: Male    Birth control/protection: Condom  Other Topics Concern  . Not on file  Social History Narrative  . Not on file   Social Determinants of Health   Financial Resource Strain: Not on file  Food Insecurity: Not on file  Transportation Needs: Not on file  Physical Activity: Not on file  Stress: Not on file  Social Connections: Not on file  Intimate Partner  Violence: Not on file    Review of Systems  All other systems reviewed and are negative.   PHYSICAL EXAMINATION:    BP 110/60 (BP Location: Right Arm, Patient Position: Sitting, Cuff Size: Normal)   Pulse 90   Resp 14   Ht 5' 3.25" (1.607 m)   Wt 115 lb (52.2 kg)   LMP 04/04/2021   BMI 20.21 kg/m     General appearance: alert, cooperative and appears stated age  Pelvic ultrasound  Indications: Menorrhagia  Findings:  Anteverted Uterus 6.78 x 5.2 x 4.4 cm No myometrial masses  Endometrium 8.09 mm, symmetrical, no masses  Left ovary 3.79 x 2.44 x 2.6 cm 2.23 x 2.39 cm complex appearing cyst, suspicious for endometrioma  Right ovary 4.86 x 3.87 x 3.22 cm 2.52 x 2.34 cm complex cyst, suspect hemorrhagic CL 1.61 x 1.5 cm simple cyst Right ovary is stuck to the posterior uterus  No free fluid   Impression:  Normal appearing uterus Bilateral ovarian cysts Suspect endometrioma  Adhesion of right ovary to uterus Normal  ovarian perfusion bilaterally  1. Menorrhagia with regular cycle No findings on ultrasound to cause menorrhagia  2. Low iron stores On iron  3. Right ovarian cyst Suspect hemorrhagic CL Recommend f/u ultrasound in 6 months  4. Left ovarian cyst Suspect endometrioma Discussed endometriosis, information given Recommend f/u ultrasound in 6 months

## 2021-04-22 ENCOUNTER — Other Ambulatory Visit: Payer: Self-pay | Admitting: *Deleted

## 2021-04-22 DIAGNOSIS — N83201 Unspecified ovarian cyst, right side: Secondary | ICD-10-CM

## 2022-09-02 ENCOUNTER — Encounter: Payer: Self-pay | Admitting: Gastroenterology
